# Patient Record
Sex: Female | Born: 1955 | Race: Black or African American | Hispanic: No | Smoking: Never smoker
Health system: Southern US, Community
[De-identification: ages and names within clinical notes are randomized; demographics above are authoritative.]

## PROBLEM LIST (undated history)

## (undated) DIAGNOSIS — T7840XA Allergy, unspecified, initial encounter: Secondary | ICD-10-CM

## (undated) DIAGNOSIS — E119 Type 2 diabetes mellitus without complications: Secondary | ICD-10-CM

## (undated) DIAGNOSIS — R32 Unspecified urinary incontinence: Secondary | ICD-10-CM

## (undated) DIAGNOSIS — D649 Anemia, unspecified: Secondary | ICD-10-CM

## (undated) HISTORY — DX: Anemia, unspecified: D64.9

## (undated) HISTORY — PX: ABDOMINAL HYSTERECTOMY: SHX81

## (undated) HISTORY — DX: Unspecified urinary incontinence: R32

## (undated) HISTORY — PX: KNEE SURGERY: SHX244

## (undated) HISTORY — DX: Allergy, unspecified, initial encounter: T78.40XA

## (undated) HISTORY — DX: Type 2 diabetes mellitus without complications: E11.9

## (undated) HISTORY — PX: CHOLECYSTECTOMY: SHX55

---

## 1998-03-11 ENCOUNTER — Ambulatory Visit (HOSPITAL_COMMUNITY): Admission: RE | Admit: 1998-03-11 | Discharge: 1998-03-11 | Payer: Self-pay | Admitting: Family Medicine

## 1998-08-27 ENCOUNTER — Other Ambulatory Visit: Admission: RE | Admit: 1998-08-27 | Discharge: 1998-08-27 | Payer: Self-pay | Admitting: *Deleted

## 1999-07-05 ENCOUNTER — Encounter: Admission: RE | Admit: 1999-07-05 | Discharge: 1999-07-29 | Payer: Self-pay | Admitting: *Deleted

## 1999-08-12 ENCOUNTER — Encounter: Admission: RE | Admit: 1999-08-12 | Discharge: 1999-11-10 | Payer: Self-pay | Admitting: *Deleted

## 1999-10-28 ENCOUNTER — Encounter: Admission: RE | Admit: 1999-10-28 | Discharge: 1999-10-28 | Payer: Self-pay | Admitting: *Deleted

## 1999-11-11 ENCOUNTER — Encounter: Admission: RE | Admit: 1999-11-11 | Discharge: 1999-11-11 | Payer: Self-pay | Admitting: *Deleted

## 1999-11-11 ENCOUNTER — Encounter: Payer: Self-pay | Admitting: *Deleted

## 1999-12-04 ENCOUNTER — Encounter: Payer: Self-pay | Admitting: Emergency Medicine

## 1999-12-04 ENCOUNTER — Emergency Department (HOSPITAL_COMMUNITY): Admission: EM | Admit: 1999-12-04 | Discharge: 1999-12-04 | Payer: Self-pay | Admitting: Emergency Medicine

## 1999-12-08 ENCOUNTER — Encounter: Admission: RE | Admit: 1999-12-08 | Discharge: 2000-03-07 | Payer: Self-pay | Admitting: *Deleted

## 2001-03-26 ENCOUNTER — Other Ambulatory Visit: Admission: RE | Admit: 2001-03-26 | Discharge: 2001-03-26 | Payer: Self-pay | Admitting: Obstetrics and Gynecology

## 2001-04-04 ENCOUNTER — Encounter: Payer: Self-pay | Admitting: Gastroenterology

## 2001-04-04 ENCOUNTER — Ambulatory Visit (HOSPITAL_COMMUNITY): Admission: RE | Admit: 2001-04-04 | Discharge: 2001-04-04 | Payer: Self-pay | Admitting: Gastroenterology

## 2001-04-10 ENCOUNTER — Ambulatory Visit (HOSPITAL_COMMUNITY): Admission: RE | Admit: 2001-04-10 | Discharge: 2001-04-10 | Payer: Self-pay | Admitting: Gastroenterology

## 2001-04-16 ENCOUNTER — Encounter (HOSPITAL_BASED_OUTPATIENT_CLINIC_OR_DEPARTMENT_OTHER): Payer: Self-pay | Admitting: General Surgery

## 2001-04-16 ENCOUNTER — Ambulatory Visit (HOSPITAL_COMMUNITY): Admission: RE | Admit: 2001-04-16 | Discharge: 2001-04-16 | Payer: Self-pay | Admitting: General Surgery

## 2001-04-23 ENCOUNTER — Encounter (HOSPITAL_BASED_OUTPATIENT_CLINIC_OR_DEPARTMENT_OTHER): Payer: Self-pay | Admitting: General Surgery

## 2001-04-25 ENCOUNTER — Encounter (INDEPENDENT_AMBULATORY_CARE_PROVIDER_SITE_OTHER): Payer: Self-pay | Admitting: Specialist

## 2001-04-25 ENCOUNTER — Ambulatory Visit (HOSPITAL_COMMUNITY): Admission: RE | Admit: 2001-04-25 | Discharge: 2001-04-26 | Payer: Self-pay | Admitting: General Surgery

## 2001-06-18 ENCOUNTER — Ambulatory Visit (HOSPITAL_COMMUNITY): Admission: RE | Admit: 2001-06-18 | Discharge: 2001-06-18 | Payer: Self-pay | Admitting: Family Medicine

## 2001-06-18 ENCOUNTER — Encounter: Payer: Self-pay | Admitting: Family Medicine

## 2002-02-27 ENCOUNTER — Encounter (INDEPENDENT_AMBULATORY_CARE_PROVIDER_SITE_OTHER): Payer: Self-pay | Admitting: *Deleted

## 2002-02-27 ENCOUNTER — Ambulatory Visit (HOSPITAL_COMMUNITY): Admission: RE | Admit: 2002-02-27 | Discharge: 2002-02-27 | Payer: Self-pay | Admitting: Gastroenterology

## 2002-05-19 ENCOUNTER — Emergency Department (HOSPITAL_COMMUNITY): Admission: EM | Admit: 2002-05-19 | Discharge: 2002-05-19 | Payer: Self-pay

## 2002-06-14 ENCOUNTER — Ambulatory Visit (HOSPITAL_COMMUNITY): Admission: RE | Admit: 2002-06-14 | Discharge: 2002-06-14 | Payer: Self-pay | Admitting: Anesthesiology

## 2002-06-14 ENCOUNTER — Encounter: Payer: Self-pay | Admitting: Family Medicine

## 2003-08-21 ENCOUNTER — Ambulatory Visit (HOSPITAL_COMMUNITY): Admission: RE | Admit: 2003-08-21 | Discharge: 2003-08-21 | Payer: Self-pay | Admitting: Obstetrics

## 2003-11-12 ENCOUNTER — Encounter (INDEPENDENT_AMBULATORY_CARE_PROVIDER_SITE_OTHER): Payer: Self-pay | Admitting: *Deleted

## 2003-11-12 ENCOUNTER — Inpatient Hospital Stay (HOSPITAL_COMMUNITY): Admission: RE | Admit: 2003-11-12 | Discharge: 2003-11-15 | Payer: Self-pay | Admitting: Obstetrics

## 2005-07-11 ENCOUNTER — Ambulatory Visit (HOSPITAL_COMMUNITY): Admission: RE | Admit: 2005-07-11 | Discharge: 2005-07-11 | Payer: Self-pay | Admitting: Obstetrics

## 2007-12-06 ENCOUNTER — Encounter: Admission: RE | Admit: 2007-12-06 | Discharge: 2007-12-06 | Payer: Self-pay | Admitting: Internal Medicine

## 2010-10-03 ENCOUNTER — Encounter: Payer: Self-pay | Admitting: Internal Medicine

## 2011-01-28 NOTE — Op Note (Signed)
NAME:  Anna Johns, Anna Johns                      ACCOUNT NO.:  [id number]   MEDICAL RECORD NO.:  [id number]                   PATIENT TYPE:  INP   LOCATION:  9313                                 FACILITY:  [facility]   PHYSICIAN:  Charles A. Clearance Coots, M.D.             DATE OF BIRTH:  1956-03-03   DATE OF PROCEDURE:  11/12/2003  DATE OF DISCHARGE:                                 OPERATIVE REPORT   PREOPERATIVE DIAGNOSIS:  Symptomatic uterine fibroids.   POSTOPERATIVE DIAGNOSIS:  Symptomatic uterine fibroids.   PROCEDURE:  Total abdominal hysterectomy, bilateral salpingectomy.   SURGEON:  Charles A. Clearance Coots, M.D.   ASSISTANT:  Roseanna Rainbow, M.D.   ANESTHESIA:  General.   ESTIMATED BLOOD LOSS:  300 mL.   FLUIDS REPLACED:  2200 mL.   URINE OUTPUT:  200 mL clear.   COMPLICATIONS:  None.   Foley to gravity.   FINDINGS:  A 14-week size fibroid uterus.   SPECIMENS:  Uterus with cervix, two fallopian tubes.   OPERATION:  The patient was brought to the operating room and after  satisfactory general endotracheal anesthesia, the abdomen was prepped and  draped in the usual sterile fashion and an indwelling Foley was placed.  A  Pfannenstiel skin incision was made with a scalpel.  It was deepened down to  the fascia with a scalpel.  The fascia was nicked in the midline and the  fascial incision was extended to the left and to the right with curved Mayo  scissors.  The superior and inferior fascial edges were taken off of the  rectus muscles with both blunt and sharp dissection.  The rectus muscle was  bluntly and sharply dissected in the midline.  The peritoneum was entered  digitally and was sharply extended superiorly and inferiorly, being careful  to avoid the urinary bladder inferiorly.  The uterus was then exteriorized  after the bowel was packed off.  The utero-ovarian and broad ligaments were  crossclamped on both sides with long Kelly forceps.  The round ligaments  were  identified and were transected with the Bovie and the vesicouterine  fold of peritoneum above the reflection of urinary bladder was transected  sharply with the Bovie and the urinary bladder was pushed down out of the  operative field.  The broad ligament was then indented digitally on the  right side close to the body of the uterus and the opening was created  digitally.  The utero-ovarian clamp was extended down into the opening and a  second curved parametrial clamp was placed beneath the Kelly forceps.  The  utero-ovarian and broad ligaments were then transected and suture ligated  with a transfixion suture of 0 Vicryl after a free tie was placed first of 0  Vicryl.  The same procedure was performed on the opposite side without  complications.  The uterine vessels were then skeletonized and then the  uterine vessels were crossclamped with curved parametrial  clamps.  A  straight Heaney clamp was placed above for prevention of backflow.  The  vessels were transected and suture ligated with transfixion sutures of 0  Vicryl.  The same procedure was performed on the opposite side without  complications.  The cardinal ligaments were then serially crossclamped, cut,  and suture ligated with transfixion sutures of 0 Vicryl bilaterally down to  the uterosacral ligaments.  The uterosacral ligaments were crossclamped with  curved parametrial clamps and transected and suture ligated bilaterally with  0 Vicryl transfixion sutures.  The vaginal cuff was then crossclamped with  curved parametrial clamps from each corner, which met in the center.  The  cervix and uterus were then cut away from the parametrial clamps and  submitted to pathology for evaluation.  The corners of the vaginal cuff were  suture ligated with transfixion sutures of 0 Vicryl.  The center of the  vaginal cuff was closed with figure-of-eight sutures of 0 Vicryl.  Hemostasis was excellent.  The left fallopian tube was then grasped  with the  Babcock clamp, was crossclamped at the base with Kelly forceps and was  transected and suture ligated with transfixion sutures of 0 Vicryl.  The  same procedure was performed on the opposite side without complications.  The pelvic cavity was then thoroughly irrigated with warm saline solution.  The pedicles were all examined and there was no active bleeding noted.  The  vaginal cuff was re-examined and there was no active bleeding noted.  All  packing and the self-retaining retractor which had been placed were then  removed. The abdomen was then closed as follows:  The peritoneum was grasped  with Kelly forceps and was closed with a continuous suture of 2-0 Monocryl.  The rectus muscle was approximated with interrupted sutures of 2-0 Monocryl.  The fascia was closed with a continuous suture of 0 PDS from each corner to  the center.  The subcutaneous tissue was thoroughly irrigated with warm  saline solution and all areas of subcutaneous bleeding were coagulated with  the Bovie.  The skin was then approximated with a continuous subcuticular  suture of 3-0 Monocryl.  A sterile bandage was applied to the incision  closure.  The surgical technician indicated that all needle, sponge, and  instrument counts were correct.  The patient tolerated the procedure well  and was transported to the recovery room in satisfactory condition.                                               Charles A. Clearance Coots, M.D.    CAH/MEDQ  D:  11/12/2003  T:  11/12/2003  Job:  [id number]

## 2011-01-28 NOTE — Op Note (Signed)
[place]. [facility]  Patient:    Anna Johns, Anna Johns                   MRN: [id number] Proc. Date: 04/25/01 Adm. Date:  [id number] Attending:  Sonda Primes CC:         Anselmo Rod, M.D.   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  OPERATION PERFORMED:  Laparoscopic cholecystectomy.  SURGEON:  Mardene Celeste. Lurene Shadow, M.D.  ASSISTANT:  Marnee Spring. Wiliam Ke, M.D.  ANESTHESIA:  General.  INDICATIONS FOR PROCEDURE:  The patient is a 55 year old woman with recurrent epigastric and right upper quadrant pain with radiation to the subscapular region associated with nausea and vomiting usually following meals.  She had a gallbladder ultrasound which showed no evidence of stones.  She had a hepatobiliary scan with gallbladder function study which showed essentially normal gallbladder function study.  She, however, continues to have severe epigastric and right upper quadrant pain following meals.  She is brought to the operating room now for laparoscopic cholecystectomy after the risks and benefits of surgery have been described.  DESCRIPTION OF PROCEDURE:  Following the induction of satisfactory general anesthesia, with the patient positioned supinely, the abdomen is routinely prepped and draped to be included in a sterile operative field.  Incision was approached through an infraumbilical incision that was done by removing a previous keloidal scar in the infraumbilical area where she had had previous laparoscopic surgery.  This was deepened through the skin and subcutaneous tissues down to the midline fascia.  The fascia was opened and the abdomen entered.  Hasson cannula was inserted and the peritoneum inflated to 14 mmHg pressure using CO2.  Visual exploration of the abdomen ensued.  The liver was somewhat lobulated but otherwise normal  with the duodenum and anterior gastric wall somewhat tethered to the gallbladder due to multiple scarring and evidence of inflammation to the gallbladder.  The remainder of the small and large  intestine appeared to be normal.  The adnexal structures and uterus were not seen.  Under direct vision, the epigastric and lateral ports were placed.  The gallbladder was then grasped and retracted cephalad.  Multiple adhesions to the gallbladder taken down and the dissection carried down to the ampulla of the gallbladder.  At the gallbladder ampulla there was a rather large cystic duct.  However, there was isolated and traced up to its entry into the gallbladder and down to the common duct and the cystic artery was also traced up to its entry in the gallbladder wall.  Because the patient is also allergic to intravenous dyes, we did not attempt to do a cholangiogram; however, the cystic duct was clipped proximally and opened and milked until there were no stones within the cystic duct itself. The cystic duct was then doubly clipped and transected.  The cystic artery also doubly clipped and transected.  The gallbladder dissected free from the liver bed using electrocautery and maintaining hemostasis throughout the entire dissection.  At the end of dissection, the liver bed was then thoroughly checked for hemostasis. Additional bleeding points were treated with electrocautery.  The right upper quadrant then thoroughly irrigated with saline.  Camera moved to the epigastric port and the gallbladder retrieved through the umbilical port without difficulty.  All trocars were removed under direct vision.  Sponge, instrument and sharp counts were verified.  Pneumoperitoneum deflated and the wounds closed in layers  as follows:  Umbilical wound in two layers with 0 Dexon and 4-0 Dexon.  Epigastric and lateral flank wounds closed with 4-0 Dexon sutures.  All wounds were reinforced with Steri-Strips and  sterile dressings applied.  Anesthetic reversed.  Patient removed from the operating room to the recovery room in stable condition having tolerated the procedure well. DD:  04/25/01 TD:  04/25/01 Job: [id number]

## 2011-01-28 NOTE — Procedures (Signed)
[place]. [facility]  Patient:    ALEXA, BLISH Visit Number: [id number] MRN: [id number]          Service Type: END Location: ENDO Attending Physician:  Charna Elizabeth Dictated by:   Anselmo Rod, M.D. Proc. Date: 02/27/02 Admit Date:  02/27/2002   CC:         Harlan Stains, M.D.   Procedure Report  DATE OF BIRTH:  [birth year]  PROCEDURE PERFORMED:  Esophagogastroduodenoscopy.  ENDOSCOPIST: Anselmo Rod, M.D.  INSTRUMENT USED:  Olympus video panendoscope.  INDICATION FOR PROCEDURE: The patient is a 54 year old African-American female with epigastric tenderness, rule out peptic ulcer disease, esophagitis, gastritis, etc.  PREPROCEDURE PREPARATION:  Informed consent was procured from the patient. The patient was fasted for eight hours prior to the procedure.  PREPROCEDURE PHYSICAL:  VITAL SIGNS: The patient had stable vital signs.  NECK:  Supple.  CHEST:  Clear to auscultation.  ABDOMEN:  Soft with normal bowel sounds.  DESCRIPTION OF PROCEDURE: The patient was placed in the left lateral decubitus position and sedated with 55.0 mg of Demerol and 6 mg of Versed intravenously. Once the patient was adequately sedated and maintained on low flow oxygen and continuous cardiac monitoring, the Olympus video panendoscope was advanced with a mouth piece over the tongue and into the esophagus under direct vision.  The entire esophagus appeared normal with no evidence of stricture or esophagitis.  The scope was then advanced to the stomach. There was moderate diffuse gastritis throughout the gastric mucosa.  Biopsies were done for H. pylori.  The proximal small bowel appeared normal and without lesions.  No frank ulcers, erosions, masses or polyps were seen.  IMPRESSION: Moderate diffuse gastritis, biopsies done.  RECOMMENDATION: 1. Prevacid 30 mg one p.o. q.d., #30 prescribed with three refills. 2. Await pathology results. 3. Treat  with antibiotics if Helicobacter pylori is present. 4. Avoid all nonsteroidals including aspirin. 5. Outpatient follow-up in the next two weeks. Dictated by:   Anselmo Rod, M.D. Attending Physician:  Charna Elizabeth DD:  02/27/02 TD:  02/28/02 Job: [id number]

## 2011-01-28 NOTE — Procedures (Signed)
[place]. [facility]  Patient:    Anna Johns, Anna Johns                   MRN: [id number] Proc. Date: 04/11/01 Adm. Date:  [id number] Attending:  Charna Elizabeth CC:         [name], M.D.  [name], M.D.   Procedure Report  DATE OF BIRTH:  12-Aug-1956  REFERRING PHYSICIAN:  [name], M.D.  PROCEDURE PERFORMED:  Esophagogastroduodenoscopy.  ENDOSCOPIST:  Anselmo Rod, M.D.  INSTRUMENT USED:  Olympus video panendoscope.  INDICATIONS FOR PROCEDURE:  Abdominal pain especially in the right upper quadrant epigastric area in a 55 year old African-American female with a normal abdominal ultrasound and HIDA scan, rule out peptic ulcer disease.  PREPROCEDURE PREPARATION:  Informed consent was procured from the patient. The patient was fasted for eight hours prior to the procedure.  PREPROCEDURE PHYSICAL:  The patient had stable vital signs.  Neck supple. Chest clear to auscultation.  S1, S2 regular.  Abdomen soft with epigastric and right upper quadrant tenderness on palpation.  No guarding, no rebound, no rigidity, no hepatosplenomegaly.  DESCRIPTION OF PROCEDURE:  The patient was placed in left lateral decubitus position and sedated with 40 mg of Demerol and 4 mg of Versed intravenously. Once the patient was adequately sedated and maintained on low-flow oxygen and continuous cardiac monitoring, the Olympus video panendoscope was advanced through the mouthpiece, over the tongue, into the esophagus under direct vision.  The entire esophagus appeared normal without evidence of ring, stricture, masses, lesions, esophagitis or Barretts mucosa.  The scope was then advanced to the stomach.  There was healthy-appearing gastric mucosa. There was mild duodenitis.  On examination of the duodenum bulb the small bowel distal to the bulb appeared normal.  There was no outlet obstruction. The patient tolerated the procedure well without  complications.  IMPRESSION:  Normal-appearing esophagus, stomach and proximal small bowel except for mild duodenitis in the bulb.  RECOMMENDATION: 1. Continue proton pump inhibitor for now. 2. Surgical evaluation by Dr. Lurene Shadow for possible cholecystectomy,    question biliary dyskinesia. DD:  04/11/01 TD:  04/11/01 Job: [id number]

## 2011-01-28 NOTE — Discharge Summary (Signed)
NAME:  Anna Johns, Anna Johns                      ACCOUNT NO.:  [id number]   MEDICAL RECORD NO.:  [id number]                   PATIENT TYPE:  INP   LOCATION:  9313                                 FACILITY:  [facility]   PHYSICIAN:  Roseanna Rainbow, M.D.         DATE OF BIRTH:  1955/09/27   DATE OF ADMISSION:  11/12/2003  DATE OF DISCHARGE:  11/15/2003                                 DISCHARGE SUMMARY   CHIEF COMPLAINT:  The patient is a 55 year old African American female, para  2, with symptomatic uterine fibroids who presents for a total abdominal  hysterectomy.   HISTORY OF PRESENT ILLNESS:  The patient describes heavy menses.  The  menorrhagia has been refractory to medical therapy.   ALLERGIES:  No known drug allergies.   MEDICATIONS:  Iron, ibuprofen, Nor-QD.   PAST SURGICAL HISTORY:  1. Bilateral tubal ligation.  2. Cholecystectomy.   PAST MEDICAL HISTORY:  She denies.   PHYSICAL EXAMINATION:  VITAL SIGNS:  Temperature 98.9, pulse 88, blood  pressure 115/70, weight 214 pounds, height 5 foot 7 inches.  GENERAL APPEARANCE:  Well nourished, well developed, African American female  in no apparent distress.  HEENT:  Normocephalic, atraumatic.  NECK:  Supple.  No adenopathy.  LUNGS:  Clear to auscultation bilaterally.  HEART:  Regular rate and rhythm.  BREASTS:  No masses.  No discharge.  Nontender bilaterally.  ABDOMEN:  Soft, nontender.  No organomegaly.  PELVIC:  The external female genitalia are normal-appearing.  On speculum  exam the vagina is clean.  On bimanual exam the uterus is 12-14 weeks'  aggregate size.  The adnexa are not palpable.  EXTREMITIES:  No clubbing, cyanosis or edema.  SKIN:  Without rash.   ASSESSMENT:  Symptomatic uterine fibroids.   PLAN:  Total abdominal hysterectomy.   HOSPITAL COURSE:  The patient was admitted and underwent a total abdominal  hysterectomy and bilateral salpingectomies.  Please see the dictated  operative summary as per Dr.  Coral Ceo.  Her postoperative course was  uneventful.  She was discharged to home on postoperative day #3, tolerating  a regular diet.   DISCHARGE DIAGNOSES:  Symptomatic uterine fibroids.   PROCEDURES:  1. Total abdominal hysterectomy.  2. Bilateral salpingectomies.   CONDITION:  Good.   DIET:  Regular.   ACTIVITY:  No strenuous activity, no intercourse.   MEDICATIONS:  1. Percocet 1-2 tabs every six hours as needed.  2. Ferrous sulfate 1 tab p.o. twice a day.   DISPOSITION:  The patient was to followup in the office with Dr. Clearance Coots in  two weeks.                                               Roseanna Rainbow, M.D.    Judee Clara  D:  11/15/2003  T:  11/15/2003  Job:  [id number]

## 2011-11-06 ENCOUNTER — Other Ambulatory Visit: Payer: Self-pay | Admitting: Physician Assistant

## 2011-11-19 ENCOUNTER — Telehealth: Payer: Self-pay | Admitting: Internal Medicine

## 2011-11-19 MED ORDER — OXYBUTYNIN CHLORIDE ER 5 MG PO TB24: 5.0000 mg | ORAL_TABLET | Freq: Every day | ORAL | Status: DC

## 2011-11-19 NOTE — Telephone Encounter (Signed)
Ditropan RF, pt agrees to OV before more RF's.

## 2011-12-01 ENCOUNTER — Ambulatory Visit (INDEPENDENT_AMBULATORY_CARE_PROVIDER_SITE_OTHER): Payer: BC Managed Care – PPO | Admitting: Physician Assistant

## 2011-12-01 VITALS — BP 157/82 | HR 88 | Temp 98.3°F | Resp 16 | Ht 66.5 in | Wt 225.0 lb

## 2011-12-01 DIAGNOSIS — R030 Elevated blood-pressure reading, without diagnosis of hypertension: Secondary | ICD-10-CM

## 2011-12-01 DIAGNOSIS — R7989 Other specified abnormal findings of blood chemistry: Secondary | ICD-10-CM

## 2011-12-01 DIAGNOSIS — E8881 Metabolic syndrome: Secondary | ICD-10-CM

## 2011-12-01 DIAGNOSIS — E669 Obesity, unspecified: Secondary | ICD-10-CM

## 2011-12-01 LAB — POCT CBC
Granulocyte percent: 48.7 %G (ref 37–80)
Hemoglobin: 12.3 g/dL (ref 12.2–16.2)
MCH, POC: 28.1 pg (ref 27–31.2)
MID (cbc): 0.3 (ref 0–0.9)
MPV: 8.9 fL (ref 0–99.8)
POC MID %: 6.1 %M (ref 0–12)
Platelet Count, POC: 289 10*3/uL (ref 142–424)
RBC: 4.37 M/uL (ref 4.04–5.48)
WBC: 4.5 10*3/uL — AB (ref 4.6–10.2)

## 2011-12-01 MED ORDER — OXYBUTYNIN CHLORIDE ER 5 MG PO TB24: 5.0000 mg | ORAL_TABLET | Freq: Every day | ORAL | Status: DC

## 2011-12-01 MED ORDER — MELOXICAM 15 MG PO TABS: 15.0000 mg | ORAL_TABLET | Freq: Every day | ORAL | Status: DC

## 2011-12-01 NOTE — Progress Notes (Signed)
  Subjective:    Patient ID: Anna Johns, female    DOB: 31-Mar-1956, 56 y.o.   MRN: [id number]  HPI 56 yo AA female here for refill on Ditropan XL.  She has been taking it since the fall and it is working great. Recent arthroscopic knee surgery and is healing/recovering well.  Due for MMG in the summer.  Reviewed last labs from 02/2011 and her NF glucose was elevated.  She is fasting today.  She is also interested in changing her eating and losing weight.   Review of Systems  All other systems reviewed and are negative.      Objective:   Physical Exam  Constitutional: She is oriented to person, place, and time. She appears well-developed and well-nourished.  HENT:  Head: Normocephalic and atraumatic.  Neck: Normal range of motion. Neck supple.  Cardiovascular: Normal rate, regular rhythm and normal heart sounds.   Pulmonary/Chest: Effort normal and breath sounds normal.  Neurological: She is alert and oriented to person, place, and time.  Skin: Skin is warm and dry.          Assessment & Plan:  Elevated BP w/o diagnosis of htn/obesity-likely intertwined.  Advised [employer].  Overactive bladder-Well-controlled on Ditropan. Have MMG report sent to [place].  Check fasting labs

## 2011-12-02 LAB — TSH: TSH: 1.131 u[IU]/mL (ref 0.350–4.500)

## 2011-12-02 LAB — COMPREHENSIVE METABOLIC PANEL
ALT: 14 U/L (ref 0–35)
AST: 16 U/L (ref 0–37)
Albumin: 4.6 g/dL (ref 3.5–5.2)
CO2: 26 mEq/L (ref 19–32)
Calcium: 9.4 mg/dL (ref 8.4–10.5)
Chloride: 104 mEq/L (ref 96–112)
Creat: 0.78 mg/dL (ref 0.50–1.10)
Potassium: 4 mEq/L (ref 3.5–5.3)
Sodium: 140 mEq/L (ref 135–145)
Total Protein: 7.5 g/dL (ref 6.0–8.3)

## 2011-12-02 LAB — LIPID PANEL
Cholesterol: 204 mg/dL — ABNORMAL HIGH (ref 0–200)
Total CHOL/HDL Ratio: 4.5 Ratio

## 2011-12-16 ENCOUNTER — Other Ambulatory Visit: Payer: Self-pay | Admitting: Internal Medicine

## 2011-12-19 ENCOUNTER — Telehealth (HOSPITAL_COMMUNITY): Payer: Self-pay | Admitting: *Deleted

## 2011-12-19 NOTE — ED Notes (Signed)
Monica from [employer] asked if pt. had any visits here between 05/2010 and 05/2011. I accessed chart and said no. Vassie Moselle 12/19/2011

## 2011-12-23 NOTE — Telephone Encounter (Signed)
Pt is trying to figure out if [employer] has sent over ppwork to be filled out by Dr she is stating they are trying to say that a injury is basically an existing injury and she is saying it is not, please contact her, she gave me a # to [employer] [phone]

## 2012-01-14 ENCOUNTER — Telehealth: Payer: Self-pay

## 2012-01-14 NOTE — Telephone Encounter (Signed)
Pt has a question regarding documentation in medical records regarding recommended knee surgery in June of 2012.

## 2012-01-15 NOTE — Telephone Encounter (Signed)
Patient has a question about her [employer] paperwork and what we told her at her visit. She feels [employer] is not telling her the truth.

## 2012-12-18 ENCOUNTER — Other Ambulatory Visit: Payer: Self-pay | Admitting: Physician Assistant

## 2012-12-20 ENCOUNTER — Ambulatory Visit (INDEPENDENT_AMBULATORY_CARE_PROVIDER_SITE_OTHER): Payer: BC Managed Care – PPO | Admitting: Family Medicine

## 2012-12-20 VITALS — BP 152/86 | HR 89 | Temp 98.3°F | Resp 16 | Ht 67.0 in | Wt 230.0 lb

## 2012-12-20 DIAGNOSIS — R32 Unspecified urinary incontinence: Secondary | ICD-10-CM

## 2012-12-20 DIAGNOSIS — R319 Hematuria, unspecified: Secondary | ICD-10-CM

## 2012-12-20 DIAGNOSIS — R81 Glycosuria: Secondary | ICD-10-CM

## 2012-12-20 DIAGNOSIS — D649 Anemia, unspecified: Secondary | ICD-10-CM

## 2012-12-20 LAB — POCT CBC
HCT, POC: 34.5 % — AB (ref 37.7–47.9)
Lymph, poc: 2 (ref 0.6–3.4)
MCHC: 30.4 g/dL — AB (ref 31.8–35.4)
MCV: 89.2 fL (ref 80–97)
MID (cbc): 0.5 (ref 0–0.9)
POC Granulocyte: 3 (ref 2–6.9)
POC LYMPH PERCENT: 36.5 %L (ref 10–50)
Platelet Count, POC: 265 10*3/uL (ref 142–424)
RDW, POC: 13.6 %

## 2012-12-20 LAB — POCT URINALYSIS DIPSTICK
Glucose, UA: 100
Leukocytes, UA: NEGATIVE
Nitrite, UA: POSITIVE
pH, UA: 5.5

## 2012-12-20 LAB — GLUCOSE, POCT (MANUAL RESULT ENTRY): POC Glucose: 101 mg/dl — AB (ref 70–99)

## 2012-12-20 LAB — POCT UA - MICROSCOPIC ONLY
Casts, Ur, LPF, POC: NEGATIVE
Mucus, UA: NEGATIVE

## 2012-12-20 MED ORDER — OXYBUTYNIN CHLORIDE ER 5 MG PO TB24: 5.0000 mg | ORAL_TABLET | Freq: Every day | ORAL | Status: DC

## 2012-12-20 MED ORDER — OXYBUTYNIN CHLORIDE ER 10 MG PO TB24: 10.0000 mg | ORAL_TABLET | Freq: Every day | ORAL | Status: DC

## 2012-12-20 MED ORDER — CIPROFLOXACIN HCL 500 MG PO TABS: 500.0000 mg | ORAL_TABLET | Freq: Two times a day (BID) | ORAL | Status: DC

## 2012-12-20 NOTE — Patient Instructions (Addendum)
Hematuria - Plan: POCT urinalysis dipstick, POCT UA - Microscopic Only, POCT CBC, POCT glucose (manual entry), POCT glycosylated hemoglobin (Hb A1C), Comprehensive metabolic panel, Urine culture, ciprofloxacin (CIPRO) 500 MG tablet  Glycosuria  Urinary incontinence    1.  Recommend evaluation by primary care physician for anemia. 2. If blood in urine persists despite antibiotics, will need reevaluation and referral to a urologist.

## 2012-12-20 NOTE — Progress Notes (Signed)
[address]   [place], [place]  [address]   [phone]  Subjective:    Patient ID: Anna Johns, female    DOB: 07-19-1956, 57 y.o.   MRN: [id number]  HPI This 57 y.o. female presents for evaluation of the following:  1. Hematuria: onset this morning; occurred throughout the day.  No dysuria; +frequency; +urgency; nocturia chronically.  Chronic urinary incontinence and maintained on oxybutynin; needs refill.  No history of hematuria.  No flank pain; no n/v.  Felt funny when came in; got a little dizzy.  No other sites of bleeding.  S/p hysterectomy; ovaries remaining.  No vaginal spotting.  No history of kidney stones.  No rectal bleeding. Sure that bleeding coming from urine.     Review of Systems  Constitutional: Negative for fever, chills, diaphoresis and fatigue.  Gastrointestinal: Negative for nausea, vomiting, blood in stool, anal bleeding and rectal pain.  Endocrine: Negative for polydipsia, polyphagia and polyuria.  Genitourinary: Positive for urgency, frequency and hematuria. Negative for dysuria, flank pain, decreased urine volume, vaginal bleeding, vaginal discharge, enuresis, genital sores, vaginal pain and pelvic pain.    Past Medical History  Diagnosis Date  . Urinary incontinence     Past Surgical History  Procedure Laterality Date  . Cholecystectomy    . Knee surgery    . Abdominal hysterectomy      fibroids; ovaries intact.    Prior to Admission medications   Medication Sig Start Date End Date Taking? Authorizing Provider  oxybutynin (DITROPAN-XL) 10 MG 24 hr tablet Take 1 tablet (10 mg total) by mouth daily. 12/20/12  Yes Ethelda Chick, MD  ciprofloxacin (CIPRO) 500 MG tablet Take 1 tablet (500 mg total) by mouth 2 (two) times daily. 12/20/12   Ethelda Chick, MD    No Known Allergies  History   Social History  . Marital Status: Married    Spouse Name: N/A    Number of Children: N/A  . Years of Education: N/A   Occupational History  . Not on  file.   Social History Main Topics  . Smoking status: Never Smoker   . Smokeless tobacco: Not on file  . Alcohol Use: Not on file  . Drug Use: Not on file  . Sexually Active: No   Other Topics Concern  . Not on file   Social History Narrative  . No narrative on file    Family History  Problem Relation Age of Onset  . COPD Mother   . Heart disease Mother     CHF  . Multiple myeloma Father   . Lupus Brother        Objective:   Physical Exam  Nursing note and vitals reviewed. Constitutional: She is oriented to person, place, and time. She appears well-developed and well-nourished. No distress.  Cardiovascular: Normal rate, regular rhythm and normal heart sounds.   Pulmonary/Chest: Effort normal and breath sounds normal. She has no wheezes. She has no rales.  Abdominal: Soft. Bowel sounds are normal. She exhibits no distension. There is no tenderness. There is no rebound and no guarding.  Neurological: She is alert and oriented to person, place, and time.  Skin: Skin is warm and dry. No rash noted. She is not diaphoretic.  Psychiatric: She has a normal mood and affect. Her behavior is normal. Judgment and thought content normal.          Results for orders placed in visit on 12/20/12  POCT URINALYSIS DIPSTICK  Result Value Range   Color, UA orange     Clarity, UA clear     Glucose, UA 100     Bilirubin, UA small     Ketones, UA trace     Spec Grav, UA >=1.030     Blood, UA neg     pH, UA 5.5     Protein, UA 30     Urobilinogen, UA 1.0     Nitrite, UA pos     Leukocytes, UA Negative    POCT UA - MICROSCOPIC ONLY      Result Value Range   WBC, Ur, HPF, POC 0-2     RBC, urine, microscopic 0-2     Bacteria, U Microscopic trace     Mucus, UA neg     Epithelial cells, urine per micros 1-2     Crystals, Ur, HPF, POC neg     Casts, Ur, LPF, POC neg     Yeast, UA neg      Assessment & Plan:  Hematuria - Plan: POCT urinalysis dipstick, POCT UA - Microscopic  Only, POCT CBC, POCT glucose (manual entry), POCT glycosylated hemoglobin (Hb A1C), Comprehensive metabolic panel, Urine culture, ciprofloxacin (CIPRO) 500 MG tablet, Ambulatory referral to Urology  Glycosuria  Urinary incontinence - Plan: Ambulatory referral to Urology  Anemia, unspecified   1. Hematuria Gross:  New.  Pt reports gross hematuria before visit.  Send urine culture; treat empirically for UTI while awaiting urine culture. If urine culture negative, will warrant urological evaluation.  Obtain BUN/Cr.  No associated flank pain, nausea, or vomiting to suggest acute nephrolithiasis. 2.  Glycosuria: New.  HgbA1c in prediabetic range and pt aware with previous diagnosis. 3. Urinary Incontinence:  Uncontrolled; agreeable to increased dose of Oxybutynin. If no improvement with increased dose, recommend urology consultation. 4. Anemia:  Chronic issue per patient; recommend follow-up with PCP to discuss and review. 5. Glucose Intolerance: New to this provider. Recommend weight loss, exercise, low sugar food intake.  Meds ordered this encounter  Medications  . ciprofloxacin (CIPRO) 500 MG tablet    Sig: Take 1 tablet (500 mg total) by mouth 2 (two) times daily.    Dispense:  20 tablet    Refill:  0  . DISCONTD: oxybutynin (DITROPAN-XL) 5 MG 24 hr tablet    Sig: Take 1 tablet (5 mg total) by mouth daily.    Dispense:  30 tablet    Refill:  11    PATIENT NEEDS OFFICE VISIT FOR ADDITIONAL REFILLS  . oxybutynin (DITROPAN-XL) 10 MG 24 hr tablet    Sig: Take 1 tablet (10 mg total) by mouth daily.    Dispense:  30 tablet    Refill:  11

## 2012-12-21 LAB — COMPREHENSIVE METABOLIC PANEL
BUN: 18 mg/dL (ref 6–23)
CO2: 27 mEq/L (ref 19–32)
Calcium: 9.7 mg/dL (ref 8.4–10.5)
Chloride: 105 mEq/L (ref 96–112)
Creat: 0.8 mg/dL (ref 0.50–1.10)
Total Bilirubin: 0.9 mg/dL (ref 0.3–1.2)

## 2012-12-22 LAB — URINE CULTURE: Colony Count: NO GROWTH

## 2013-01-04 ENCOUNTER — Encounter: Payer: Self-pay | Admitting: *Deleted

## 2013-01-13 ENCOUNTER — Encounter: Payer: Self-pay | Admitting: Family Medicine

## 2013-03-20 ENCOUNTER — Telehealth: Payer: Self-pay

## 2013-03-20 NOTE — Telephone Encounter (Signed)
Urine culture negative, referred to Urology. Left message for her to call me back.

## 2013-03-20 NOTE — Telephone Encounter (Signed)
Pt is calling because she states that she hasn't heard anything about her lab results from her visit and she also states that her symptoms are getting worse and she said that she does have an apt with the dr we referred her to coming up Call back number is [phone]

## 2013-03-22 NOTE — Telephone Encounter (Signed)
LMOM to CB. 

## 2013-03-26 NOTE — Telephone Encounter (Signed)
Patient not returning calls. Hopefully she has seen urology.

## 2013-05-20 ENCOUNTER — Encounter: Payer: Self-pay | Admitting: Obstetrics

## 2013-07-20 ENCOUNTER — Ambulatory Visit (INDEPENDENT_AMBULATORY_CARE_PROVIDER_SITE_OTHER): Payer: BC Managed Care – PPO | Admitting: Family Medicine

## 2013-07-20 VITALS — BP 130/80 | HR 109 | Temp 98.2°F | Resp 16 | Ht 67.0 in | Wt 223.0 lb

## 2013-07-20 DIAGNOSIS — K5289 Other specified noninfective gastroenteritis and colitis: Secondary | ICD-10-CM

## 2013-07-20 DIAGNOSIS — K529 Noninfective gastroenteritis and colitis, unspecified: Secondary | ICD-10-CM

## 2013-07-20 DIAGNOSIS — R197 Diarrhea, unspecified: Secondary | ICD-10-CM

## 2013-07-20 DIAGNOSIS — R112 Nausea with vomiting, unspecified: Secondary | ICD-10-CM

## 2013-07-20 MED ORDER — ONDANSETRON 4 MG PO TBDP: 4.0000 mg | ORAL_TABLET | Freq: Three times a day (TID) | ORAL | Status: DC | PRN

## 2013-07-20 NOTE — Progress Notes (Signed)
Subjective:  This chart was scribed for Anna Staggers, MD by Quintella Reichert, ED scribe.  This patient was seen in room [facility] Room 4 and the patient's care was started at 3:05 PM.   Patient ID: Anna Johns, female    DOB: Nov 26, 1955, 57 y.o.   MRN: [id number]  Chief Complaint  Patient presents with   Diarrhea    x 1 day   Nausea    HPI  Anna STANGELO is a 57 y.o. female PCP: Billee Cashing, MD  Pt presents with nausea, vomiting, diarrhea and fatigue that began yesterday morning.  She states that she.had cold symptoms last week and was taking Nyquil.  She otherwise felt well until yesterday morning at 5 AM at which time she developed nausea, vomiting and diarrhea.  She vomited 3-4 times and had over 10 episodes of diarrhea yesterday.  Diarrhea is described as "watery."  She denies blood in stool or vomitus.  Diarrhea continued all throughout the night and today.  She last vomited last night and states she is no longer nauseated but her stomach "feels irritable."  She has attempted to treat symptoms with Pepto Bismol, without relief.  She has not attempted any other treatments pta.  She has been drinking water and lemonade.  She has only been able to eat saltine crackers.  She states that she urinated 2-3 times today and her urine was "extremely light."  She denies fevers.   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  She denies recent antibiotic usage, travel, or suspect food intake.  Surgical history includes cholecystectomy, tubal ligation, and partial hysterectomy.   There are no active problems to display for this patient.   Past Medical History  Diagnosis Date   Urinary incontinence    Anemia    Glucose intolerance (impaired glucose tolerance) 12/25/2012    HgbA1c of 6.1.    Past Surgical History  Procedure Laterality Date   Cholecystectomy     Knee  surgery     Abdominal hysterectomy      fibroids; ovaries intact.    Allergies  Allergen Reactions   Fish Allergy     Muscles and Bluefish    Prior to Admission medications   Medication Sig Start Date End Date Taking? Authorizing Provider  oxybutynin (DITROPAN-XL) 10 MG 24 hr tablet Take 1 tablet (10 mg total) by mouth daily. 12/20/12  Yes Ethelda Chick, MD  ;   Review of Systems  Constitutional: Positive for fatigue. Negative for fever.  Gastrointestinal: Positive for nausea, vomiting, abdominal pain (stomach "feels irritable") and diarrhea. Negative for blood in stool.  Genitourinary: Negative for decreased urine volume.       Objective:   Physical Exam  Nursing note and vitals reviewed. Constitutional: She is oriented to person, place, and time. She appears well-developed and well-nourished. No distress.  HENT:  Head: Normocephalic and atraumatic.  Right Ear: Hearing, tympanic membrane, external ear and ear canal normal.  Left Ear: Hearing, tympanic membrane, external ear and ear canal normal.  Nose: Nose normal.  Mouth/Throat: Oropharynx is clear and moist and mucous membranes are normal. No oropharyngeal exudate.  Eyes: Conjunctivae and EOM are normal. Pupils are equal, round, and reactive to light.  Neck: Neck supple. No tracheal deviation present.  Cardiovascular: Regular rhythm, normal heart sounds and intact distal pulses.  Tachycardia present.   No murmur heard. Slightly tachycardic  at a rate of 100-110  Pulmonary/Chest: Effort normal and breath sounds normal. No respiratory distress. She has no wheezes. She has no rhonchi.  Abdominal: Soft. There is tenderness (diffuse, mild tenderness to palpation.  no focal tenderness.). There is no rebound and no guarding.  Musculoskeletal: Normal range of motion.  Neurological: She is alert and oriented to person, place, and time.  Skin: Skin is warm and dry. No rash noted.  Psychiatric: She has a normal mood and affect.  Her behavior is normal.     Filed Vitals:   07/20/13 1435  BP: 130/80  Pulse: 109  Temp: 98.2 F (36.8 C)  TempSrc: Oral  Resp: 16  Height: 5\' 7"  (1.702 m)  Weight: 223 lb (101.152 kg)  SpO2: 98%        Assessment & Plan:   Anna Johns is a 57 y.o. female N&V (nausea and vomiting) - Plan: ondansetron (ZOFRAN ODT) 4 MG disintegrating tablet  Diarrhea  Noninfectious gastroenteritis and colitis  Suspected viral GE with other sick contacts, improving vomiting.  Discussed IVF, but would like to try ORT tonight - sx care discussed and RTC precautions given. Out of work Monday if needed.   Meds ordered this encounter  Medications   ondansetron (ZOFRAN ODT) 4 MG disintegrating tablet    Sig: Take 1 tablet (4 mg total) by mouth every 8 (eight) hours as needed for nausea or vomiting.    Dispense:  10 tablet    Refill:  0   Patient Instructions  Your symptoms appear to be due to a virus, and should improve in the next 24-48 hours. Frequent sips of fluids, and if needed - return here or the emergency room for IV fluids if unable to keep sufficient fluids down. Zofran if needed for nausea, and other instructions as below.    Gastroenteritis:  Diarrhea Infections caused by germs (bacterial) or a virus commonly cause diarrhea. Your caregiver has determined that with time, rest and fluids, the diarrhea should improve. In general, eat normally while drinking more water than usual. Although water may prevent dehydration, it does not contain salt and minerals (electrolytes). Broths, weak tea without caffeine and oral rehydration solutions (ORS) replace fluids and electrolytes. Small amounts of fluids should be taken frequently. Large amounts at one time may not be tolerated. Plain water may be harmful in infants and the elderly. Oral rehydrating solutions (ORS) are available at pharmacies and grocery stores. ORS replace water and important electrolytes in proper proportions. Sports  drinks are not as effective as ORS and may be harmful due to sugars worsening diarrhea.  ORS is especially recommended for use in children with diarrhea. As a general guideline for children, replace any new fluid losses from diarrhea and/or vomiting with ORS as follows:   If your child weighs 22 pounds or under (10 kg or less), give 60-120 mL ( -  cup or 2 - 4 ounces) of ORS for each episode of diarrheal stool or vomiting episode.   If your child weighs more than 22 pounds (more than 10 kgs), give 120-240 mL ( - 1 cup or 4 - 8 ounces) of ORS for each diarrheal stool or episode of vomiting.   While correcting for dehydration, children should eat normally. However, foods high in sugar should be avoided because this may worsen diarrhea. Large amounts of carbonated soft drinks, juice, gelatin desserts and other highly sugared drinks should be avoided.   After correction of dehydration, other liquids that are appealing  to the child may be added. Children should drink small amounts of fluids frequently and fluids should be increased as tolerated. Children should drink enough fluids to keep urine clear or pale yellow.   Adults should eat normally while drinking more fluids than usual. Drink small amounts of fluids frequently and increase as tolerated. Drink enough fluids to keep urine clear or pale yellow. Broths, weak decaffeinated tea, lemon lime soft drinks (allowed to go flat) and ORS replace fluids and electrolytes.   Avoid:   Carbonated drinks.   Juice.   Extremely hot or cold fluids.   Caffeine drinks.   Fatty, greasy foods.   Alcohol.   Tobacco.   Too much intake of anything at one time.   Gelatin desserts.   Probiotics are active cultures of beneficial bacteria. They may lessen the amount and number of diarrheal stools in adults. Probiotics can be found in yogurt with active cultures and in supplements.   Wash hands well to avoid spreading bacteria and virus.    Anti-diarrheal medications are not recommended for infants and children.   Only take over-the-counter or prescription medicines for pain, discomfort or fever as directed by your caregiver. Do not give aspirin to children because it may cause Reye's Syndrome.   For adults, ask your caregiver if you should continue all prescribed and over-the-counter medicines.   If your caregiver has given you a follow-up appointment, it is very important to keep that appointment. Not keeping the appointment could result in a chronic or permanent injury, and disability. If there is any problem keeping the appointment, you must call back to this facility for assistance.  SEEK IMMEDIATE MEDICAL CARE IF:   You or your child is unable to keep fluids down or other symptoms or problems become worse in spite of treatment.   Vomiting or diarrhea develops and becomes persistent.   There is vomiting of blood or bile (green material).   There is blood in the stool or the stools are black and tarry.   There is no urine output in 6-8 hours or there is only a small amount of very dark urine.   Abdominal pain develops, increases or localizes.   You have a fever.   Your baby is older than 3 months with a rectal temperature of 102 F (38.9 C) or higher.   Your baby is 57 months old or younger with a rectal temperature of 100.4 F (38 C) or higher.   You or your child develops excessive weakness, dizziness, fainting or extreme thirst.   You or your child develops a rash, stiff neck, severe headache or become irritable or sleepy and difficult to awaken.  MAKE SURE YOU:   Understand these instructions.   Will watch your condition.   Will get help right away if you are not doing well or get worse.  Document Released: 08/19/2002 Document Revised: 08/18/2011 Document Reviewed: 07/06/2009 [facility] Patient Information 2012 [place], [place].  Nausea and Vomiting Nausea is a sick feeling that often comes before  throwing up (vomiting). Vomiting is a reflex where stomach contents come out of your mouth. Vomiting can cause severe loss of body fluids (dehydration). Children and elderly adults can become dehydrated quickly, especially if they also have diarrhea. Nausea and vomiting are symptoms of a condition or disease. It is important to find the cause of your symptoms. CAUSES   Direct irritation of the stomach lining. This irritation can result from increased acid production (gastroesophageal reflux disease), infection, food poisoning, taking  certain medicines (such as nonsteroidal anti-inflammatory drugs), alcohol use, or tobacco use.   Signals from the brain.These signals could be caused by a headache, heat exposure, an inner ear disturbance, increased pressure in the brain from injury, infection, a tumor, or a concussion, pain, emotional stimulus, or metabolic problems.   An obstruction in the gastrointestinal tract (bowel obstruction).   Illnesses such as diabetes, hepatitis, gallbladder problems, appendicitis, kidney problems, cancer, sepsis, atypical symptoms of a heart attack, or eating disorders.   Medical treatments such as chemotherapy and radiation.   Receiving medicine that makes you sleep (general anesthetic) during surgery.  DIAGNOSIS Your caregiver may ask for tests to be done if the problems do not improve after a few days. Tests may also be done if symptoms are severe or if the reason for the nausea and vomiting is not clear. Tests may include:  Urine tests.   Blood tests.   Stool tests.   Cultures (to look for evidence of infection).   X-rays or other imaging studies.  Test results can help your caregiver make decisions about treatment or the need for additional tests. TREATMENT You need to stay well hydrated. Drink frequently but in small amounts.You may wish to drink water, sports drinks, clear broth, or eat frozen ice pops or gelatin dessert to help stay hydrated.When you  eat, eating slowly may help prevent nausea.There are also some antinausea medicines that may help prevent nausea. HOME CARE INSTRUCTIONS   Take all medicine as directed by your caregiver.   If you do not have an appetite, do not force yourself to eat. However, you must continue to drink fluids.   If you have an appetite, eat a normal diet unless your caregiver tells you differently.   Eat a variety of complex carbohydrates (rice, wheat, potatoes, bread), lean meats, yogurt, fruits, and vegetables.   Avoid high-fat foods because they are more difficult to digest.   Drink enough water and fluids to keep your urine clear or pale yellow.   If you are dehydrated, ask your caregiver for specific rehydration instructions. Signs of dehydration may include:   Severe thirst.   Dry lips and mouth.   Dizziness.   Dark urine.   Decreasing urine frequency and amount.   Confusion.   Rapid breathing or pulse.  SEEK IMMEDIATE MEDICAL CARE IF:   You have blood or brown flecks (like coffee grounds) in your vomit.   You have black or bloody stools.   You have a severe headache or stiff neck.   You are confused.   You have severe abdominal pain.   You have chest pain or trouble breathing.   You do not urinate at least once every 8 hours.   You develop cold or clammy skin.   You continue to vomit for longer than 24 to 48 hours.   You have a fever.  MAKE SURE YOU:   Understand these instructions.   Will watch your condition.   Will get help right away if you are not doing well or get worse.  Document Released: 08/29/2005 Document Revised: 08/18/2011 Document Reviewed: 01/26/2011 [facility] Patient Information 2012 [place], [place].  Return to the clinic or go to the nearest emergency room if any of your symptoms worsen or new symptoms occur.    I personally performed the services described in this documentation, which was scribed in my presence. The recorded information has  been reviewed and considered, and addended by me as needed.

## 2013-07-20 NOTE — Patient Instructions (Signed)
Your symptoms appear to be due to a virus, and should improve in the next 24-48 hours. Frequent sips of fluids, and if needed - return here or the emergency room for IV fluids if unable to keep sufficient fluids down. Zofran if needed for nausea, and other instructions as below.    Gastroenteritis:  Diarrhea Infections caused by germs (bacterial) or a virus commonly cause diarrhea. Your caregiver has determined that with time, rest and fluids, the diarrhea should improve. In general, eat normally while drinking more water than usual. Although water may prevent dehydration, it does not contain salt and minerals (electrolytes). Broths, weak tea without caffeine and oral rehydration solutions (ORS) replace fluids and electrolytes. Small amounts of fluids should be taken frequently. Large amounts at one time may not be tolerated. Plain water may be harmful in infants and the elderly. Oral rehydrating solutions (ORS) are available at pharmacies and grocery stores. ORS replace water and important electrolytes in proper proportions. Sports drinks are not as effective as ORS and may be harmful due to sugars worsening diarrhea.  ORS is especially recommended for use in children with diarrhea. As a general guideline for children, replace any new fluid losses from diarrhea and/or vomiting with ORS as follows:   If your child weighs 22 pounds or under (10 kg or less), give 60-120 mL ( -  cup or 2 - 4 ounces) of ORS for each episode of diarrheal stool or vomiting episode.   If your child weighs more than 22 pounds (more than 10 kgs), give 120-240 mL ( - 1 cup or 4 - 8 ounces) of ORS for each diarrheal stool or episode of vomiting.   While correcting for dehydration, children should eat normally. However, foods high in sugar should be avoided because this may worsen diarrhea. Large amounts of carbonated soft drinks, juice, gelatin desserts and other highly sugared drinks should be avoided.   After correction  of dehydration, other liquids that are appealing to the child may be added. Children should drink small amounts of fluids frequently and fluids should be increased as tolerated. Children should drink enough fluids to keep urine clear or pale yellow.   Adults should eat normally while drinking more fluids than usual. Drink small amounts of fluids frequently and increase as tolerated. Drink enough fluids to keep urine clear or pale yellow. Broths, weak decaffeinated tea, lemon lime soft drinks (allowed to go flat) and ORS replace fluids and electrolytes.   Avoid:   Carbonated drinks.   Juice.   Extremely hot or cold fluids.   Caffeine drinks.   Fatty, greasy foods.   Alcohol.   Tobacco.   Too much intake of anything at one time.   Gelatin desserts.   Probiotics are active cultures of beneficial bacteria. They may lessen the amount and number of diarrheal stools in adults. Probiotics can be found in yogurt with active cultures and in supplements.   Wash hands well to avoid spreading bacteria and virus.   Anti-diarrheal medications are not recommended for infants and children.   Only take over-the-counter or prescription medicines for pain, discomfort or fever as directed by your caregiver. Do not give aspirin to children because it may cause Reye's Syndrome.   For adults, ask your caregiver if you should continue all prescribed and over-the-counter medicines.   If your caregiver has given you a follow-up appointment, it is very important to keep that appointment. Not keeping the appointment could result in a chronic or permanent injury, and  disability. If there is any problem keeping the appointment, you must call back to this facility for assistance.  SEEK IMMEDIATE MEDICAL CARE IF:   You or your child is unable to keep fluids down or other symptoms or problems become worse in spite of treatment.   Vomiting or diarrhea develops and becomes persistent.   There is vomiting of  blood or bile (green material).   There is blood in the stool or the stools are black and tarry.   There is no urine output in 6-8 hours or there is only a small amount of very dark urine.   Abdominal pain develops, increases or localizes.   You have a fever.   Your baby is older than 3 months with a rectal temperature of 102 F (38.9 C) or higher.   Your baby is 93 months old or younger with a rectal temperature of 100.4 F (38 C) or higher.   You or your child develops excessive weakness, dizziness, fainting or extreme thirst.   You or your child develops a rash, stiff neck, severe headache or become irritable or sleepy and difficult to awaken.  MAKE SURE YOU:   Understand these instructions.   Will watch your condition.   Will get help right away if you are not doing well or get worse.  Document Released: 08/19/2002 Document Revised: 08/18/2011 Document Reviewed: 07/06/2009 [facility] Patient Information 2012 [place], [place].  Nausea and Vomiting Nausea is a sick feeling that often comes before throwing up (vomiting). Vomiting is a reflex where stomach contents come out of your mouth. Vomiting can cause severe loss of body fluids (dehydration). Children and elderly adults can become dehydrated quickly, especially if they also have diarrhea. Nausea and vomiting are symptoms of a condition or disease. It is important to find the cause of your symptoms. CAUSES   Direct irritation of the stomach lining. This irritation can result from increased acid production (gastroesophageal reflux disease), infection, food poisoning, taking certain medicines (such as nonsteroidal anti-inflammatory drugs), alcohol use, or tobacco use.   Signals from the brain.These signals could be caused by a headache, heat exposure, an inner ear disturbance, increased pressure in the brain from injury, infection, a tumor, or a concussion, pain, emotional stimulus, or metabolic problems.   An obstruction in the  gastrointestinal tract (bowel obstruction).   Illnesses such as diabetes, hepatitis, gallbladder problems, appendicitis, kidney problems, cancer, sepsis, atypical symptoms of a heart attack, or eating disorders.   Medical treatments such as chemotherapy and radiation.   Receiving medicine that makes you sleep (general anesthetic) during surgery.  DIAGNOSIS Your caregiver may ask for tests to be done if the problems do not improve after a few days. Tests may also be done if symptoms are severe or if the reason for the nausea and vomiting is not clear. Tests may include:  Urine tests.   Blood tests.   Stool tests.   Cultures (to look for evidence of infection).   X-rays or other imaging studies.  Test results can help your caregiver make decisions about treatment or the need for additional tests. TREATMENT You need to stay well hydrated. Drink frequently but in small amounts.You may wish to drink water, sports drinks, clear broth, or eat frozen ice pops or gelatin dessert to help stay hydrated.When you eat, eating slowly may help prevent nausea.There are also some antinausea medicines that may help prevent nausea. HOME CARE INSTRUCTIONS   Take all medicine as directed by your caregiver.   If you  do not have an appetite, do not force yourself to eat. However, you must continue to drink fluids.   If you have an appetite, eat a normal diet unless your caregiver tells you differently.   Eat a variety of complex carbohydrates (rice, wheat, potatoes, bread), lean meats, yogurt, fruits, and vegetables.   Avoid high-fat foods because they are more difficult to digest.   Drink enough water and fluids to keep your urine clear or pale yellow.   If you are dehydrated, ask your caregiver for specific rehydration instructions. Signs of dehydration may include:   Severe thirst.   Dry lips and mouth.   Dizziness.   Dark urine.   Decreasing urine frequency and amount.   Confusion.    Rapid breathing or pulse.  SEEK IMMEDIATE MEDICAL CARE IF:   You have blood or brown flecks (like coffee grounds) in your vomit.   You have black or bloody stools.   You have a severe headache or stiff neck.   You are confused.   You have severe abdominal pain.   You have chest pain or trouble breathing.   You do not urinate at least once every 8 hours.   You develop cold or clammy skin.   You continue to vomit for longer than 24 to 48 hours.   You have a fever.  MAKE SURE YOU:   Understand these instructions.   Will watch your condition.   Will get help right away if you are not doing well or get worse.  Document Released: 08/29/2005 Document Revised: 08/18/2011 Document Reviewed: 01/26/2011 [facility] Patient Information 2012 [place].  Return to the clinic or go to the nearest emergency room if any of your symptoms worsen or new symptoms occur.

## 2013-12-14 ENCOUNTER — Other Ambulatory Visit: Payer: Self-pay | Admitting: Family Medicine

## 2014-01-21 ENCOUNTER — Other Ambulatory Visit: Payer: Self-pay | Admitting: Physician Assistant

## 2014-01-26 ENCOUNTER — Other Ambulatory Visit: Payer: Self-pay | Admitting: Physician Assistant

## 2014-02-22 ENCOUNTER — Other Ambulatory Visit: Payer: Self-pay | Admitting: Physician Assistant

## 2014-03-16 ENCOUNTER — Other Ambulatory Visit: Payer: Self-pay | Admitting: Physician Assistant

## 2014-03-17 ENCOUNTER — Ambulatory Visit (INDEPENDENT_AMBULATORY_CARE_PROVIDER_SITE_OTHER): Payer: BC Managed Care – PPO | Admitting: Family Medicine

## 2014-03-17 VITALS — BP 152/90 | HR 100 | Temp 98.1°F | Resp 18 | Ht 66.5 in | Wt 225.0 lb

## 2014-03-17 DIAGNOSIS — E669 Obesity, unspecified: Secondary | ICD-10-CM

## 2014-03-17 DIAGNOSIS — I10 Essential (primary) hypertension: Secondary | ICD-10-CM

## 2014-03-17 DIAGNOSIS — R609 Edema, unspecified: Secondary | ICD-10-CM

## 2014-03-17 DIAGNOSIS — R32 Unspecified urinary incontinence: Secondary | ICD-10-CM

## 2014-03-17 MED ORDER — OXYBUTYNIN CHLORIDE ER 10 MG PO TB24: 10.0000 mg | ORAL_TABLET | Freq: Every day | ORAL | Status: DC

## 2014-03-17 NOTE — Progress Notes (Deleted)
Subjective:  This chart was scribed for Norberto SorensonEva Shaw, MD by Evon Slackerrance Branch, ED Scribe. This Patient was seen in room 09 and the patients care was started at 7:18 PM   Patient ID: Anna Johns, female    DOB: 01-Jan-1956, 58 y.o.   MRN: [id number]  HPI Chief Complaint  Patient presents with   Medication Refill    Oxybutynin   Chronic urinary incontinence maintained on ditropan Seen 1 year prior for gross hematuria and referred to urology History of pre diabetes, no history of high blood pressure. Previous pressure here is 150/80.    HPI Comments: Anna Johns is a 58 y.o. female who presents to the [facility] complaining of medication refill of her ditropan. She states this medication is great. She denies having difficulty urinating. She states that most night she can sleep through the night without getting up to urinate.   She states that her legs and ankles are swollen today also onset 2 months prior. She states that she feels her ankles are more swollen in the morning. She denies ankle pain, or SOB.   Her blood pressure is elevated today. She states that this is due to new stressors on her life. She states that she is going to live a healthier lifestyle to see if that helps maintain her blood pressure.   Past Medical History  Diagnosis Date   Urinary incontinence    Anemia    Glucose intolerance (impaired glucose tolerance) 12/25/2012    HgbA1c of 6.1.   Allergy    No current outpatient prescriptions on file prior to visit.   No current facility-administered medications on file prior to visit.   Allergies  Allergen Reactions   Fish Allergy     Muscles and Bluefish     Review of Systems  Respiratory: Negative for shortness of breath.   Genitourinary: Negative for difficulty urinating.  Musculoskeletal: Negative for arthralgias.   Objective:  BP 152/90   Pulse 100   Temp(Src) 98.1 F (36.7 C) (Oral)   Resp 18   Ht 5' 6.5" (1.689 m)    Wt 225 lb (102.059 kg)   BMI 35.78 kg/m2   SpO2 98%  Physical Exam  Nursing note and vitals reviewed. Constitutional: She is oriented to person, place, and time. She appears well-developed and well-nourished. No distress.  HENT:  Head: Normocephalic and atraumatic.  Eyes: Conjunctivae and EOM are normal.  Neck: Neck supple. No tracheal deviation present. No thyromegaly present.  Cardiovascular: Normal rate, regular rhythm, S1 normal, S2 normal and normal heart sounds.   No murmur heard. Pulses:      Dorsalis pedis pulses are 2+ on the right side, and 2+ on the left side.       Posterior tibial pulses are 2+ on the right side, and 2+ on the left side.  Pulmonary/Chest: Effort normal and breath sounds normal. No respiratory distress.  Musculoskeletal: Normal range of motion. She exhibits edema.  2+ non pitting edema on right 1+ non pitting edema on left  Neurological: She is alert and oriented to person, place, and time.  Skin: Skin is warm and dry.  Psychiatric: She has a normal mood and affect. Her behavior is normal.    Assessment & Plan:   Essential hypertension, benign  Urinary incontinence, unspecified incontinence type  Edema  Obesity (BMI 30-39.9)  Meds ordered this encounter  Medications   oxybutynin (DITROPAN-XL) 10 MG 24 hr tablet    Sig: Take 1  tablet (10 mg total) by mouth at bedtime.    Dispense:  90 tablet    Refill:  3    I personally performed the services described in this documentation, which was scribed in my presence. The recorded information has been reviewed and considered, and addended by me as needed.  Norberto SorensonEva Shaw, MD MPH

## 2014-03-17 NOTE — Patient Instructions (Addendum)
Peripheral Edema You have swelling in your legs (peripheral edema). This swelling is due to excess accumulation of salt and water in your body. Edema may be a sign of heart, kidney or liver disease, or a side effect of a medication. It may also be due to problems in the leg veins. Elevating your legs and using special support stockings may be very helpful, if the cause of the swelling is due to poor venous circulation. Avoid long periods of standing, whatever the cause. Treatment of edema depends on identifying the cause. Chips, pretzels, pickles and other salty foods should be avoided. Restricting salt in your diet is almost always needed. Water pills (diuretics) are often used to remove the excess salt and water from your body via urine. These medicines prevent the kidney from reabsorbing sodium. This increases urine flow. Diuretic treatment may also result in lowering of potassium levels in your body. Potassium supplements may be needed if you have to use diuretics daily. Daily weights can help you keep track of your progress in clearing your edema. You should call your caregiver for follow up care as recommended. SEEK IMMEDIATE MEDICAL CARE IF:   You have increased swelling, pain, redness, or heat in your legs.  You develop shortness of breath, especially when lying down.  You develop chest or abdominal pain, weakness, or fainting.  You have a fever. Document Released: 10/06/2004 Document Revised: 11/21/2011 Document Reviewed: 09/16/2009 [facility] Patient Information 2015 [place], [place]. This information is not intended to replace advice given to you by your health care provider. Make sure you discuss any questions you have with your health care provider.   Edema Edema is an abnormal buildup of fluids in your bodytissues. Edema is somewhatdependent on gravity to pull the fluid to the lowest place in your body. That makes the condition more common in the legs and thighs (lower extremities).  Painless swelling of the feet and ankles is common and becomes more likely as you get older. It is also common in looser tissues, like around your eyes.  When the affected area is squeezed, the fluid may move out of that spot and leave a dent for a few moments. This dent is called pitting.  CAUSES  There are many possible causes of edema. Eating too much salt and being on your feet or sitting for a long time can cause edema in your legs and ankles. Hot weather may make edema worse. Common medical causes of edema include:  Heart failure.  Liver disease.  Kidney disease.  Weak blood vessels in your legs.  Cancer.  An injury.  Pregnancy.  Some medications.  Obesity. SYMPTOMS  Edema is usually painless.Your skin may look swollen or shiny.  DIAGNOSIS  Your health care provider may be able to diagnose edema by asking about your medical history and doing a physical exam. You may need to have tests such as X-rays, an electrocardiogram, or blood tests to check for medical conditions that may cause edema.  TREATMENT  Edema treatment depends on the cause. If you have heart, liver, or kidney disease, you need the treatment appropriate for these conditions. General treatment may include:  Elevation of the affected body part above the level of your heart.  Compression of the affected body part. Pressure from elastic bandages or support stockings squeezes the tissues and forces fluid back into the blood vessels. This keeps fluid from entering the tissues.  Restriction of fluid and salt intake.  Use of a water pill (diuretic). These medications  are appropriate only for some types of edema. They pull fluid out of your body and make you urinate more often. This gets rid of fluid and reduces swelling, but diuretics can have side effects. Only use diuretics as directed by your health care provider. HOME CARE INSTRUCTIONS   Keep the affected body part above the level of your heart when you are  lying down.   Do not sit still or stand for prolonged periods.   Do not put anything directly under your knees when lying down.  Do not wear constricting clothing or garters on your upper legs.   Exercise your legs to work the fluid back into your blood vessels. This may help the swelling go down.   Wear elastic bandages or support stockings to reduce ankle swelling as directed by your health care provider.   Eat a low-salt diet to reduce fluid if your health care provider recommends it.   Only take medicines as directed by your health care provider. SEEK MEDICAL CARE IF:   Your edema is not responding to treatment.  You have heart, liver, or kidney disease and notice symptoms of edema.  You have edema in your legs that does not improve after elevating them.   You have sudden and unexplained weight gain. SEEK IMMEDIATE MEDICAL CARE IF:   You develop shortness of breath or chest pain.   You cannot breathe when you lie down.  You develop pain, redness, or warmth in the swollen areas.   You have heart, liver, or kidney disease and suddenly get edema.  You have a fever and your symptoms suddenly get worse. MAKE SURE YOU:   Understand these instructions.  Will watch your condition.  Will get help right away if you are not doing well or get worse. Document Released: 08/29/2005 Document Revised: 09/03/2013 Document Reviewed: 06/21/2013 [facility] Patient Information 2015 [place]. This information is not intended to replace advice given to you by your health care provider. Make sure you discuss any questions you have with your health care provider. Hypertension Hypertension, commonly called high blood pressure, is when the force of blood pumping through your arteries is too strong. Your arteries are the blood vessels that carry blood from your heart throughout your body. A blood pressure reading consists of a higher number over a lower number, such as 110/72. The  higher number (systolic) is the pressure inside your arteries when your heart pumps. The lower number (diastolic) is the pressure inside your arteries when your heart relaxes. Ideally you want your blood pressure below 120/80. Hypertension forces your heart to work harder to pump blood. Your arteries may become narrow or stiff. Having hypertension puts you at risk for heart disease, stroke, and other problems.  RISK FACTORS Some risk factors for high blood pressure are controllable. Others are not.  Risk factors you cannot control include:   Race. You may be at higher risk if you are African American.  Age. Risk increases with age.  Gender. Men are at higher risk than women before age 57 years. After age 57, women are at higher risk than men. Risk factors you can control include:  Not getting enough exercise or physical activity.  Being overweight.  Getting too much fat, sugar, calories, or salt in your diet.  Drinking too much alcohol. SIGNS AND SYMPTOMS Hypertension does not usually cause signs or symptoms. Extremely high blood pressure (hypertensive crisis) may cause headache, anxiety, shortness of breath, and nosebleed. DIAGNOSIS  To check if you  have hypertension, your health care provider will measure your blood pressure while you are seated, with your arm held at the level of your heart. It should be measured at least twice using the same arm. Certain conditions can cause a difference in blood pressure between your right and left arms. A blood pressure reading that is higher than normal on one occasion does not mean that you need treatment. If one blood pressure reading is high, ask your health care provider about having it checked again. TREATMENT  Treating high blood pressure includes making lifestyle changes and possibly taking medication. Living a healthy lifestyle can help lower high blood pressure. You may need to change some of your habits. Lifestyle changes may  include:  Following the DASH diet. This diet is high in fruits, vegetables, and whole grains. It is low in salt, red meat, and added sugars.  Getting at least 2 1/2 hours of brisk physical activity every week.  Losing weight if necessary.  Not smoking.  Limiting alcoholic beverages.  Learning ways to reduce stress. If lifestyle changes are not enough to get your blood pressure under control, your health care provider may prescribe medicine. You may need to take more than one. Work closely with your health care provider to understand the risks and benefits. HOME CARE INSTRUCTIONS  Have your blood pressure rechecked as directed by your health care provider.   Only take medicine as directed by your health care provider. Follow the directions carefully. Blood pressure medicines must be taken as prescribed. The medicine does not work as well when you skip doses. Skipping doses also puts you at risk for problems.   Do not smoke.   Monitor your blood pressure at home as directed by your health care provider. SEEK MEDICAL CARE IF:   You think you are having a reaction to medicines taken.  You have recurrent headaches or feel dizzy.  You have swelling in your ankles.  You have trouble with your vision. SEEK IMMEDIATE MEDICAL CARE IF:  You develop a severe headache or confusion.  You have unusual weakness, numbness, or feel faint.  You have severe chest or abdominal pain.  You vomit repeatedly.  You have trouble breathing. MAKE SURE YOU:   Understand these instructions.  Will watch your condition.  Will get help right away if you are not doing well or get worse. Document Released: 08/29/2005 Document Revised: 09/03/2013 Document Reviewed: 06/21/2013 [facility] Patient Information 2015 [place], [place]. This information is not intended to replace advice given to you by your health care provider. Make sure you discuss any questions you have with your health care  provider. DASH Eating Plan DASH stands for "Dietary Approaches to Stop Hypertension." The DASH eating plan is a healthy eating plan that has been shown to reduce high blood pressure (hypertension). Additional health benefits may include reducing the risk of type 2 diabetes mellitus, heart disease, and stroke. The DASH eating plan may also help with weight loss. WHAT DO I NEED TO KNOW ABOUT THE DASH EATING PLAN? For the DASH eating plan, you will follow these general guidelines:  Choose foods with a percent daily value for sodium of less than 5% (as listed on the food label).  Use salt-free seasonings or herbs instead of table salt or sea salt.  Check with your health care provider or pharmacist before using salt substitutes.  Eat lower-sodium products, often labeled as "lower sodium" or "no salt added."  Eat fresh foods.  Eat more vegetables, fruits, and  low-fat dairy products.  Choose whole grains. Look for the word "whole" as the first word in the ingredient list.  Choose fish and skinless chicken or [place] more often than red meat. Limit fish, poultry, and meat to 6 oz (170 g) each day.  Limit sweets, desserts, sugars, and sugary drinks.  Choose heart-healthy fats.  Limit cheese to 1 oz (28 g) per day.  Eat more home-cooked food and less restaurant, buffet, and fast food.  Limit fried foods.  Cook foods using methods other than frying.  Limit canned vegetables. If you do use them, rinse them well to decrease the sodium.  When eating at a restaurant, ask that your food be prepared with less salt, or no salt if possible. WHAT FOODS CAN I EAT? Seek help from a dietitian for individual calorie needs. Grains Whole grain or whole wheat bread. Brown rice. Whole grain or whole wheat pasta. Quinoa, bulgur, and whole grain cereals. Low-sodium cereals. Corn or whole wheat flour tortillas. Whole grain cornbread. Whole grain crackers. Low-sodium crackers. Vegetables Fresh or frozen  vegetables (raw, steamed, roasted, or grilled). Low-sodium or reduced-sodium tomato and vegetable juices. Low-sodium or reduced-sodium tomato sauce and paste. Low-sodium or reduced-sodium canned vegetables.  Fruits All fresh, canned (in natural juice), or frozen fruits. Meat and Other Protein Products Ground beef (85% or leaner), grass-fed beef, or beef trimmed of fat. Skinless chicken or [place]. Ground chicken or [place]. Pork trimmed of fat. All fish and seafood. Eggs. Dried beans, peas, or lentils. Unsalted nuts and seeds. Unsalted canned beans. Dairy Low-fat dairy products, such as skim or 1% milk, 2% or reduced-fat cheeses, low-fat ricotta or cottage cheese, or plain low-fat yogurt. Low-sodium or reduced-sodium cheeses. Fats and Oils Tub margarines without trans fats. Light or reduced-fat mayonnaise and salad dressings (reduced sodium). Avocado. Safflower, olive, or canola oils. Natural peanut or almond butter. Other Unsalted popcorn and pretzels. The items listed above may not be a complete list of recommended foods or beverages. Contact your dietitian for more options. WHAT FOODS ARE NOT RECOMMENDED? Grains White bread. White pasta. White rice. Refined cornbread. Bagels and croissants. Crackers that contain trans fat. Vegetables Creamed or fried vegetables. Vegetables in a cheese sauce. Regular canned vegetables. Regular canned tomato sauce and paste. Regular tomato and vegetable juices. Fruits Dried fruits. Canned fruit in light or heavy syrup. Fruit juice. Meat and Other Protein Products Fatty cuts of meat. Ribs, chicken wings, bacon, sausage, bologna, salami, chitterlings, fatback, hot dogs, bratwurst, and packaged luncheon meats. Salted nuts and seeds. Canned beans with salt. Dairy Whole or 2% milk, cream, half-and-half, and cream cheese. Whole-fat or sweetened yogurt. Full-fat cheeses or blue cheese. Nondairy creamers and whipped toppings. Processed cheese, cheese spreads, or cheese  curds. Condiments Onion and garlic salt, seasoned salt, table salt, and sea salt. Canned and packaged gravies. Worcestershire sauce. Tartar sauce. Barbecue sauce. Teriyaki sauce. Soy sauce, including reduced sodium. Steak sauce. Fish sauce. Oyster sauce. Cocktail sauce. Horseradish. Ketchup and mustard. Meat flavorings and tenderizers. Bouillon cubes. Hot sauce. Tabasco sauce. Marinades. Taco seasonings. Relishes. Fats and Oils Butter, stick margarine, lard, shortening, ghee, and bacon fat. Coconut, palm kernel, or palm oils. Regular salad dressings. Other Pickles and olives. Salted popcorn and pretzels. The items listed above may not be a complete list of foods and beverages to avoid. Contact your dietitian for more information. WHERE CAN I FIND MORE INFORMATION? National Heart, Lung, and Blood Institute: [employer].itwww.nhlbi.nih.gov/health/health-topics/topics/dash/ Document Released: 08/18/2011 Document Revised: 09/03/2013 Document Reviewed: 07/03/2013 [employer] Patient Information  2015 [employer]. This information is not intended to replace advice given to you by your health care provider. Make sure you discuss any questions you have with your health care provider.  

## 2014-04-08 NOTE — Progress Notes (Signed)
Subjective:  This chart was scribed for Anna SorensonEva Jalynn Waddell, MD by Evon Slackerrance Branch, ED Scribe. This Patient was seen in room 09 and the patients care was started at 7:18 PM   Patient ID: Anna QuarryAnnette R Johns, female    DOB: 16-Apr-1956, 58 y.o.   MRN: [id number]  HPI Chief Complaint  Patient presents with  . Medication Refill    Oxybutynin   Chronic urinary incontinence maintained on ditropan Seen 1 year prior for gross hematuria and referred to urology History of pre diabetes, no history of high blood pressure. Previous pressure here is 150/80.    HPI Comments: Anna Quarrynnette R Narine is a 58 y.o. female who presents to the [facility] complaining of medication refill of her ditropan. She states this medication is great. She denies having difficulty urinating. She states that most night she can sleep through the night without getting up to urinate.   She states that her legs and ankles are swollen today also onset 2 months prior. She states that she feels her ankles are more swollen in the morning. She denies ankle pain, or SOB.   Her blood pressure is elevated today. She states that this is due to new stressors on her life. She states that she is going to live a healthier lifestyle to see if that helps maintain her blood pressure.   Past Medical History  Diagnosis Date  . Urinary incontinence   . Anemia   . Glucose intolerance (impaired glucose tolerance) 12/25/2012    HgbA1c of 6.1.  . Allergy    No current outpatient prescriptions on file prior to visit.   No current facility-administered medications on file prior to visit.   Allergies  Allergen Reactions  . Fish Allergy     Muscles and Bluefish     Review of Systems  Respiratory: Negative for shortness of breath.   Genitourinary: Negative for difficulty urinating.  Musculoskeletal: Negative for arthralgias.   Objective:  BP 152/90  Pulse 100  Temp(Src) 98.1 F (36.7 C) (Oral)  Resp 18  Ht 5' 6.5" (1.689 m)   Wt 225 lb (102.059 kg)  BMI 35.78 kg/m2  SpO2 98%  Physical Exam  Nursing note and vitals reviewed. Constitutional: She is oriented to person, place, and time. She appears well-developed and well-nourished. No distress.  HENT:  Head: Normocephalic and atraumatic.  Eyes: Conjunctivae and EOM are normal.  Neck: Neck supple. No tracheal deviation present. No thyromegaly present.  Cardiovascular: Normal rate, regular rhythm, S1 normal, S2 normal and normal heart sounds.   No murmur heard. Pulses:      Dorsalis pedis pulses are 2+ on the right side, and 2+ on the left side.       Posterior tibial pulses are 2+ on the right side, and 2+ on the left side.  Pulmonary/Chest: Effort normal and breath sounds normal. No respiratory distress.  Musculoskeletal: Normal range of motion. She exhibits edema.  2+ non pitting edema on right 1+ non pitting edema on left  Neurological: She is alert and oriented to person, place, and time.  Skin: Skin is warm and dry.  Psychiatric: She has a normal mood and affect. Her behavior is normal.    Assessment & Plan:   Essential hypertension, benign  Urinary incontinence, unspecified incontinence type  Edema  Obesity (BMI 30-39.9)  Meds ordered this encounter  Medications  . oxybutynin (DITROPAN-XL) 10 MG 24 hr tablet    Sig: Take 1 tablet (10 mg total) by mouth at  bedtime.    Dispense:  90 tablet    Refill:  3    I personally performed the services described in this documentation, which was scribed in my presence. The recorded information has been reviewed and considered, and addended by me as needed.  Anna Sorenson, MD MPH

## 2014-10-12 ENCOUNTER — Ambulatory Visit (INDEPENDENT_AMBULATORY_CARE_PROVIDER_SITE_OTHER): Payer: BC Managed Care – PPO | Admitting: Internal Medicine

## 2014-10-12 VITALS — BP 144/78 | HR 100 | Temp 98.0°F | Resp 17 | Ht 68.0 in | Wt 219.0 lb

## 2014-10-12 DIAGNOSIS — J029 Acute pharyngitis, unspecified: Secondary | ICD-10-CM

## 2014-10-12 LAB — POCT CBC
GRANULOCYTE PERCENT: 68.7 % (ref 37–80)
HCT, POC: 37.4 % — AB (ref 37.7–47.9)
HEMOGLOBIN: 11.7 g/dL — AB (ref 12.2–16.2)
LYMPH, POC: 1.5 (ref 0.6–3.4)
MCH: 28.4 pg (ref 27–31.2)
MCHC: 31.4 g/dL — AB (ref 31.8–35.4)
MCV: 90.6 fL (ref 80–97)
MID (CBC): 0.2 (ref 0–0.9)
MPV: 7.4 fL (ref 0–99.8)
POC GRANULOCYTE: 3.7 (ref 2–6.9)
POC LYMPH PERCENT: 27.7 %L (ref 10–50)
POC MID %: 3.6 % (ref 0–12)
Platelet Count, POC: 247 10*3/uL (ref 142–424)
RBC: 4.12 M/uL (ref 4.04–5.48)
RDW, POC: 15.3 %
WBC: 5.4 10*3/uL (ref 4.6–10.2)

## 2014-10-12 LAB — POCT RAPID STREP A (OFFICE): RAPID STREP A SCREEN: NEGATIVE

## 2014-10-12 NOTE — Progress Notes (Signed)
Subjective:  This chart was scribed for Anna Sia, MD by Haywood Pao, ED Scribe at [facility].The patient was seen in exam room 01 and the patient's care was started at 3:33 PM.   Patient ID: Anna Johns, female    DOB: [birth year], 59 y.o.   MRN: [id number] Chief Complaint  Patient presents with  . Sore Throat    throat swelling    HPI  HPI Comments: Anna Johns is a 59 y.o. female who presents to [facility] complaining of sore throat, onset 1 day ago.  However pt states she has not been feeling well for a little over a week. She has fatigue, trouble sleeping, loss of appetite as associated symptoms. 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 No hx of allergies.  There are no active problems to display for this patient.  Past Medical History  Diagnosis Date  . Urinary incontinence   . Anemia   . Glucose intolerance (impaired glucose tolerance) 12/25/2012    HgbA1c of 6.1.  . Allergy    Past Surgical History  Procedure Laterality Date  . Cholecystectomy    . Knee surgery    . Abdominal hysterectomy      fibroids; ovaries intact.  . Cholecystectomy     Allergies  Allergen Reactions  . Fish Allergy     Muscles and Bluefish   Prior to Admission medications   Medication Sig Start Date End Date Taking? Authorizing Provider  oxybutynin (DITROPAN-XL) 10 MG 24 hr tablet Take 1 tablet (10 mg total) by mouth at bedtime. 03/17/14  Yes Sherren Mocha, MD   Review of Systems  Constitutional: Positive for appetite change and fatigue. Negative for fever and chills.  HENT: Positive for sore throat.   Psychiatric/Behavioral: Positive for sleep disturbance.       Objective:  BP 144/78 mmHg  Pulse 100  Temp(Src) 98 F (36.7 C) (Oral)  Resp 17  Ht  (1.727 m)  Wt 219 lb (99.338 kg)  BMI 33.31 kg/m2  SpO2 97%  Physical Exam  Constitutional: She is oriented to person, place, and time. She appears  well-developed and well-nourished. No distress.  HENT:  Head: Normocephalic and atraumatic.  TM's, nose clear Throat is read without exudate. She is tender in anterior cervical area without discrete adenopathy.  Eyes: Pupils are equal, round, and reactive to light.  Neck: Normal range of motion.  Cardiovascular: Normal rate and regular rhythm.   Pulmonary/Chest: Effort normal. No respiratory distress.  Musculoskeletal: Normal range of motion.  Neurological: She is alert and oriented to person, place, and time.  Skin: Skin is warm and dry.  Psychiatric: She has a normal mood and affect. Her behavior is normal.  Nursing note and vitals reviewed.  Results for orders placed or performed in visit on 10/12/14  POCT rapid strep A  Result Value Ref Range   Rapid Strep A Screen Negative Negative  POCT CBC  Result Value Ref Range   WBC 5.4 4.6 - 10.2 K/uL   Lymph, poc 1.5 0.6 - 3.4   POC LYMPH PERCENT 27.7 10 - 50 %L   MID (cbc) 0.2 0 - 0.9   POC MID % 3.6 0 - 12 %M   POC Granulocyte 3.7 2 - 6.9   Granulocyte percent 68.7 37 - 80 %G   RBC 4.12 4.04 - 5.48 M/uL   Hemoglobin 11.7 (A) 12.2 - 16.2 g/dL  HCT, POC 37.4 (A) 37.7 - 47.9 %   MCV 90.6 80 - 97 fL   MCH, POC 28.4 27 - 31.2 pg   MCHC 31.4 (A) 31.8 - 35.4 g/dL   RDW, POC 16.115.3 %   Platelet Count, POC 247 142 - 424 K/uL   MPV 7.4 0 - 99.8 fL       Assessment & Plan:  Acute pharyngitis, unspecified pharyngitis type - Plan: POCT rapid strep A, Culture, Group A Strep, POCT CBC  Fatigue  viral illness likely Menthol cough drops TC sent  I have completed the patient encounter in its entirety as documented by the scribe, with editing by me where necessary. [name], M.D.

## 2014-10-15 LAB — CULTURE, GROUP A STREP: Organism ID, Bacteria: NORMAL

## 2015-03-09 ENCOUNTER — Encounter: Payer: Self-pay | Admitting: Internal Medicine

## 2015-03-09 ENCOUNTER — Ambulatory Visit (INDEPENDENT_AMBULATORY_CARE_PROVIDER_SITE_OTHER): Payer: BC Managed Care – PPO | Admitting: Internal Medicine

## 2015-03-09 VITALS — BP 162/96 | HR 89 | Ht 68.0 in | Wt 215.0 lb

## 2015-03-09 DIAGNOSIS — R002 Palpitations: Secondary | ICD-10-CM

## 2015-03-09 DIAGNOSIS — R0789 Other chest pain: Secondary | ICD-10-CM

## 2015-03-09 LAB — CBC WITH DIFFERENTIAL/PLATELET
BASOS PCT: 0.3 % (ref 0.0–3.0)
Basophils Absolute: 0 10*3/uL (ref 0.0–0.1)
EOS ABS: 0.1 10*3/uL (ref 0.0–0.7)
EOS PCT: 1 % (ref 0.0–5.0)
HCT: 38.2 % (ref 36.0–46.0)
HEMOGLOBIN: 12.7 g/dL (ref 12.0–15.0)
LYMPHS PCT: 36.6 % (ref 12.0–46.0)
Lymphs Abs: 1.8 10*3/uL (ref 0.7–4.0)
MCHC: 33.4 g/dL (ref 30.0–36.0)
MCV: 86.1 fl (ref 78.0–100.0)
MONOS PCT: 6.7 % (ref 3.0–12.0)
Monocytes Absolute: 0.3 10*3/uL (ref 0.1–1.0)
NEUTROS ABS: 2.8 10*3/uL (ref 1.4–7.7)
Neutrophils Relative %: 55.4 % (ref 43.0–77.0)
Platelets: 254 10*3/uL (ref 150.0–400.0)
RBC: 4.43 Mil/uL (ref 3.87–5.11)
RDW: 13.2 % (ref 11.5–15.5)
WBC: 5 10*3/uL (ref 4.0–10.5)

## 2015-03-09 LAB — BASIC METABOLIC PANEL
BUN: 11 mg/dL (ref 6–23)
CALCIUM: 9.7 mg/dL (ref 8.4–10.5)
CHLORIDE: 104 meq/L (ref 96–112)
CO2: 28 meq/L (ref 19–32)
CREATININE: 0.84 mg/dL (ref 0.40–1.20)
GFR: 89.57 mL/min (ref >60.00)
GLUCOSE: 104 mg/dL — AB (ref 70–99)
Potassium: 4 mEq/L (ref 3.5–5.1)
Sodium: 139 mEq/L (ref 135–145)

## 2015-03-09 LAB — TSH: TSH: 1.12 u[IU]/mL (ref 0.35–4.50)

## 2015-03-09 NOTE — Progress Notes (Signed)
Cardiology Office Note   Date:  03/09/2015   ID:  FREDDI FORSTER, DOB 1956/06/13, MRN [id number]  PCP:  Ricke Hey, MD  Cardiologist:   Dorris Carnes, MD   No chief complaint on file.  Pt is a 59 yo referred for CP     History of Present Illness: Anna Johns is a 59 y.o. female with a history of Occasional pauses and then chest pain Pain starts at uppper chest and moves down   Leaves with a funny feeling  Not associated with activity   First time while driving One at desk Occasional dizziness  NO syncope   Started about early May   Prior was felt OK  When not having spells she is feeling OK     Has always had issues with Hgb       Current Outpatient Prescriptions  Medication Sig Dispense Refill  . oxybutynin (DITROPAN-XL) 10 MG 24 hr tablet Take 1 tablet (10 mg total) by mouth at bedtime. 90 tablet 3   No current facility-administered medications for this visit.    Allergies:   Fish allergy   Past Medical History  Diagnosis Date  . Urinary incontinence   . Anemia   . Glucose intolerance (impaired glucose tolerance) 12/25/2012    HgbA1c of 6.1.  . Allergy     Past Surgical History  Procedure Laterality Date  . Cholecystectomy    . Knee surgery    . Abdominal hysterectomy      fibroids; ovaries intact.  . Cholecystectomy       Social History:  The patient  reports that she has never smoked. She does not have any smokeless tobacco history on file. She reports that she does not drink alcohol or use illicit drugs.   Family History:  The patient's family history includes COPD in her mother; Heart disease in her mother; Lupus in her brother; Multiple myeloma in her father.  Mom with enlarged  Heart Sister diagnosed with enlarge heart.  Brother ahd issues last summer with BP   ROS:  Please see the history of present illness. All other systems are reviewed and  Negative to the above problem except as noted.    PHYSICAL EXAM: VS:  BP 162/96 mmHg   Pulse 89  Ht _0  (1.727 m)  Wt 215 lb (97.523 kg)  BMI 32.70 kg/m2  GEN: Well nourished, well developed, in no acute distress HEENT: normal Neck: no JVD, carotid bruits, or masses Cardiac: RRR; no murmurs, rubs, or gallops,no edema  Respiratory:  clear to auscultation bilaterally, normal work of breathing GI: soft, nontender, nondistended, + BS  No hepatomegaly  MS: no deformity Moving all extremities   Skin: warm and dry, no rash Neuro:  Strength and sensation are intact Psych: euthymic mood, full affect   EKG:  EKG is ordered today.  Sr 89 bpm     Lipid Panel    Component Value Date/Time   CHOL 204* 12/01/2011 1328   TRIG 137 12/01/2011 1328   HDL 45 12/01/2011 1328   CHOLHDL 4.5 12/01/2011 1328   VLDL 27 12/01/2011 1328   LDLCALC 132* 12/01/2011 1328      Wt Readings from Last 3 Encounters:  03/09/15 215 lb (97.523 kg)  10/12/14 219 lb (99.338 kg)  03/17/14 225 lb (102.059 kg)      ASSESSMENT AND PLAN:  Papitations, chest pressure  Atypical chest pressure  Wil check BMET, CBC, TSH  Set up for echo  HTN  BP is high today by her reprot  WIll check on return for echo  F?U will be based on test results       Current medicines are reviewed at length with the patient today.  The patient does not have concerns regarding medicines.  The following changes have been made:   Labs/ tests ordered today include: No orders of the defined types were placed in this encounter.     Disposition:   FU with  in   Signed, Dorris Carnes, MD  03/09/2015 2:50 PM    [facility] [place], [place], [place]  [address] Phone: [phone]; Fax: [phone]

## 2015-03-09 NOTE — Patient Instructions (Addendum)
Medication Instructions:  Your physician recommends that you continue on your current medications as directed. Please refer to the Current Medication list given to you today.   Labwork: Lab work to be done today--BMP, CBC, TSH  Testing/Procedures: Your physician has requested that you have an echocardiogram. Echocardiography is a painless test that uses sound waves to create images of your heart. It provides your doctor with information about the size and shape of your heart and how well your heart's chambers and valves are working. This procedure takes approximately one hour. There are no restrictions for this procedure.  They will check your blood pressure when you are here for echocardiogram.     Follow-Up: Will be determined based on test results.    .Marland Kitchen

## 2015-03-13 ENCOUNTER — Ambulatory Visit (HOSPITAL_COMMUNITY): Payer: BC Managed Care – PPO | Attending: Internal Medicine

## 2015-03-13 ENCOUNTER — Other Ambulatory Visit: Payer: Self-pay

## 2015-03-13 DIAGNOSIS — R0789 Other chest pain: Secondary | ICD-10-CM | POA: Diagnosis not present

## 2015-03-13 DIAGNOSIS — R002 Palpitations: Secondary | ICD-10-CM

## 2015-03-20 ENCOUNTER — Other Ambulatory Visit: Payer: Self-pay | Admitting: *Deleted

## 2015-03-20 DIAGNOSIS — R002 Palpitations: Secondary | ICD-10-CM

## 2015-03-28 ENCOUNTER — Other Ambulatory Visit: Payer: Self-pay | Admitting: Family Medicine

## 2015-03-31 ENCOUNTER — Ambulatory Visit (INDEPENDENT_AMBULATORY_CARE_PROVIDER_SITE_OTHER): Payer: BC Managed Care – PPO

## 2015-03-31 DIAGNOSIS — R002 Palpitations: Secondary | ICD-10-CM | POA: Diagnosis not present

## 2015-04-30 ENCOUNTER — Other Ambulatory Visit: Payer: Self-pay | Admitting: Family Medicine

## 2015-07-04 ENCOUNTER — Other Ambulatory Visit: Payer: Self-pay | Admitting: Physician Assistant

## 2015-07-11 ENCOUNTER — Ambulatory Visit (INDEPENDENT_AMBULATORY_CARE_PROVIDER_SITE_OTHER): Payer: BC Managed Care – PPO | Admitting: Family Medicine

## 2015-07-11 VITALS — BP 158/80 | HR 113 | Temp 98.6°F | Resp 18 | Ht 66.5 in | Wt 223.2 lb

## 2015-07-11 DIAGNOSIS — R030 Elevated blood-pressure reading, without diagnosis of hypertension: Secondary | ICD-10-CM | POA: Diagnosis not present

## 2015-07-11 DIAGNOSIS — M67431 Ganglion, right wrist: Secondary | ICD-10-CM | POA: Diagnosis not present

## 2015-07-11 DIAGNOSIS — N3941 Urge incontinence: Secondary | ICD-10-CM | POA: Diagnosis not present

## 2015-07-11 DIAGNOSIS — R7303 Prediabetes: Secondary | ICD-10-CM | POA: Diagnosis not present

## 2015-07-11 DIAGNOSIS — IMO0001 Reserved for inherently not codable concepts without codable children: Secondary | ICD-10-CM

## 2015-07-11 DIAGNOSIS — R32 Unspecified urinary incontinence: Secondary | ICD-10-CM | POA: Diagnosis not present

## 2015-07-11 DIAGNOSIS — E669 Obesity, unspecified: Secondary | ICD-10-CM | POA: Diagnosis not present

## 2015-07-11 MED ORDER — OXYBUTYNIN CHLORIDE ER 10 MG PO TB24: 10.0000 mg | ORAL_TABLET | Freq: Every day | ORAL | Status: DC

## 2015-07-11 MED ORDER — ADULT BLOOD PRESSURE CUFF LG KIT: PACK | Status: DC

## 2015-07-11 NOTE — Progress Notes (Signed)
Subjective:    Patient ID: Anna Johns, female    DOB: 1955-10-31, 59 y.o.   MRN: [id number] This chart was scribed for Delman Cheadle, MD by Marti Sleigh, Medical Scribe. This patient was seen in Room 1 and the patient's care was started a 3:23 PM.  Chief Complaint  Patient presents with  . Medication Refill    Oxybutynin    HPI HPI Comments: Anna Johns is a 59 y.o. female who presents to [facility] reporting for a medication refill. She states she thinks her recent heart issues were due to stress. She denies any issue with her medications. Her urination has been normal. She denies nocturia. She states she is walking 2-3 times per week. She also states she has a swollen area on her right wrist. She denies MOI or pain.  Pt seen by me 15 mos prior for medication refill. She has seen urology due to chronic urinary incontinence controlled on ditropan. Hx of pre diabetes, at last visit she had elevated blood pressure and increasing lower extremity edema. On routine lab work within the past year her mild anemia had resolved, and BNP was normal other than glucose of 104. She was seen by cardiology four months ago for an episode of CP and heart palpitations. She had an echo that was normal, as well as an event monitor which was also normal. Her BP is persistently elevated for the past two years.   Past Medical History  Diagnosis Date  . Urinary incontinence   . Anemia   . Glucose intolerance (impaired glucose tolerance) 12/25/2012    HgbA1c of 6.1.  . Allergy    Allergies  Allergen Reactions  . Fish Allergy     Muscles and Bluefish   Current Outpatient Prescriptions on File Prior to Visit  Medication Sig Dispense Refill  . oxybutynin (DITROPAN-XL) 10 MG 24 hr tablet Take 1 tablet (10 mg total) by mouth at bedtime. NO MORE REFILLS WITHOUT OFFICE VISIT - 2ND NOTICE 15 tablet 0   No current facility-administered medications on file prior to visit.    Review of Systems  Constitutional:  Negative for fever, chills, activity change, appetite change and unexpected weight change.  Respiratory: Negative for cough, chest tightness and shortness of breath.   Cardiovascular: Negative for chest pain and palpitations.  Endocrine: Negative for polydipsia, polyphagia and polyuria.  Genitourinary: Negative for dysuria, urgency, frequency and flank pain.  Allergic/Immunologic: Negative for immunocompromised state.  Neurological: Negative for dizziness and light-headedness.       Objective:  BP 158/80 mmHg  Pulse 113  Temp(Src) 98.6 F (37 C) (Oral)  Resp 18  Ht 5' 6.5" (1.689 m)  Wt 223 lb 3.2 oz (101.243 kg)  BMI 35.49 kg/m2  SpO2 97%  Physical Exam  Constitutional: She is oriented to person, place, and time. She appears well-developed and well-nourished. No distress.  HENT:  Head: Normocephalic and atraumatic.  Eyes: Pupils are equal, round, and reactive to light.  Neck: Neck supple. No thyromegaly present.  Cardiovascular: Normal rate, regular rhythm and normal heart sounds.   No murmur heard. Pulmonary/Chest: Effort normal and breath sounds normal. No respiratory distress. She has no wheezes.  Abdominal: Soft.  Musculoskeletal: Normal range of motion.  Ganglion cyst on the dorsal aspect of right wrist. Soft fluctuant, and non adherent to overlying skin.   Lymphadenopathy:    She has no cervical adenopathy.  Neurological: She is alert and oriented to person, place, and time. Coordination normal.  Skin:  Skin is warm and dry. She is not diaphoretic.  Psychiatric: She has a normal mood and affect. Her behavior is normal.  Nursing note and vitals reviewed.     Assessment & Plan:   1. Elevated blood pressure - pt suspects white coat HTN but is not checking BP outside of office but agrees to start, call to start lisinopril if >140/90 then recheck in 2-4 wks  2. Urinary incontinence, unspecified incontinence type - doing great on oxybutynin  3. Obese - increase intensity  of the walking for exercise she is doing  4. Pre-diabetes   5.     Ganglion cyst - pt reassured, she is asymptomatic so cont watchful waiting.  Orders Placed This Encounter  Procedures  . Lipid panel    Standing Status: Future     Number of Occurrences:      Standing Expiration Date: 07/10/2016    Order Specific Question:  Has the patient fasted?    Answer:  Yes  . Comprehensive metabolic panel    Standing Status: Future     Number of Occurrences:      Standing Expiration Date: 07/10/2016    Order Specific Question:  Has the patient fasted?    Answer:  Yes  . Hemoglobin A1c    Standing Status: Future     Number of Occurrences:      Standing Expiration Date: 07/10/2016    Meds ordered this encounter  Medications  . oxybutynin (DITROPAN-XL) 10 MG 24 hr tablet    Sig: Take 1 tablet (10 mg total) by mouth at bedtime.    Dispense:  90 tablet    Refill:  3  . Blood Pressure Monitoring (ADULT BLOOD PRESSURE CUFF LG) KIT    Sig: Use as instructed    Dispense:  1 each    Refill:  0    I personally performed the services described in this documentation, which was scribed in my presence. The recorded information has been reviewed and considered, and addended by me as needed.  Delman Cheadle, MD MPH   By signing my name below, I, Judithe Modest, attest that this documentation has been prepared under the direction and in the presence of Delman Cheadle, MD. Electronically Signed: Judithe Modest, ER Scribe. 07/11/2015. 3:23 PM.

## 2015-07-11 NOTE — Patient Instructions (Signed)
Managing Your High Blood Pressure Blood pressure is a measurement of how forceful your blood is pressing against the walls of the arteries. Arteries are muscular tubes within the circulatory system. Blood pressure does not stay the same. Blood pressure rises when you are active, excited, or nervous; and it lowers during sleep and relaxation. If the numbers measuring your blood pressure stay above normal most of the time, you are at risk for health problems. High blood pressure (hypertension) is a long-term (chronic) condition in which blood pressure is elevated. A blood pressure reading is recorded as two numbers, such as 120 over 80 (or 120/80). The first, higher number is called the systolic pressure. It is a measure of the pressure in your arteries as the heart beats. The second, lower number is called the diastolic pressure. It is a measure of the pressure in your arteries as the heart relaxes between beats.  Keeping your blood pressure in a normal range is important to your overall health and prevention of health problems, such as heart disease and stroke. When your blood pressure is uncontrolled, your heart has to work harder than normal. High blood pressure is a very common condition in adults because blood pressure tends to rise with age. Men and women are equally likely to have hypertension but at different times in life. Before age 45, men are more likely to have hypertension. After 58 years of age, women are more likely to have it. Hypertension is especially common in African Americans. This condition often has no signs or symptoms. The cause of the condition is usually not known. Your caregiver can help you come up with a plan to keep your blood pressure in a normal, healthy range. BLOOD PRESSURE STAGES Blood pressure is classified into four stages: normal, prehypertension, stage 1, and stage 2. Your blood pressure reading will be used to determine what type of treatment, if any, is necessary.  Appropriate treatment options are tied to these four stages:  Normal  Systolic pressure (mm Hg): below 120.  Diastolic pressure (mm Hg): below 80. Prehypertension  Systolic pressure (mm Hg): 120 to 139.  Diastolic pressure (mm Hg): 80 to 89. Stage1  Systolic pressure (mm Hg): 140 to 159.  Diastolic pressure (mm Hg): 90 to 99. Stage2  Systolic pressure (mm Hg): 160 or above.  Diastolic pressure (mm Hg): 100 or above. RISKS RELATED TO HIGH BLOOD PRESSURE Managing your blood pressure is an important responsibility. Uncontrolled high blood pressure can lead to:  A heart attack.  A stroke.  A weakened blood vessel (aneurysm).  Heart failure.  Kidney damage.  Eye damage.  Metabolic syndrome.  Memory and concentration problems. HOW TO MANAGE YOUR BLOOD PRESSURE Blood pressure can be managed effectively with lifestyle changes and medicines (if needed). Your caregiver will help you come up with a plan to bring your blood pressure within a normal range. Your plan should include the following: Education  Read all information provided by your caregivers about how to control blood pressure.  Educate yourself on the latest guidelines and treatment recommendations. New research is always being done to further define the risks and treatments for high blood pressure. Lifestylechanges  Control your weight.  Avoid smoking.  Stay physically active.  Reduce the amount of salt in your diet.  Reduce stress.  Control any chronic conditions, such as high cholesterol or diabetes.  Reduce your alcohol intake. Medicines  Several medicines (antihypertensive medicines) are available, if needed, to bring blood pressure within a normal range.   Communication  Review all the medicines you take with your caregiver because there may be side effects or interactions.  Talk with your caregiver about your diet, exercise habits, and other lifestyle factors that may be contributing to  high blood pressure.  See your caregiver regularly. Your caregiver can help you create and adjust your plan for managing high blood pressure. RECOMMENDATIONS FOR TREATMENT AND FOLLOW-UP  The following recommendations are based on current guidelines for managing high blood pressure in nonpregnant adults. Use these recommendations to identify the proper follow-up period or treatment option based on your blood pressure reading. You can discuss these options with your caregiver.  Systolic pressure of 120 to 139 or diastolic pressure of 80 to 89: Follow up with your caregiver as directed.  Systolic pressure of 140 to 160 or diastolic pressure of 90 to 100: Follow up with your caregiver within 2 months.  Systolic pressure above 160 or diastolic pressure above 100: Follow up with your caregiver within 1 month.  Systolic pressure above 180 or diastolic pressure above 110: Consider antihypertensive therapy; follow up with your caregiver within 1 week.  Systolic pressure above 200 or diastolic pressure above 120: Begin antihypertensive therapy; follow up with your caregiver within 1 week.   This information is not intended to replace advice given to you by your health care provider. Make sure you discuss any questions you have with your health care provider.   Document Released: 05/23/2012 Document Reviewed: 05/23/2012 Elsevier Interactive Patient Education 2016 Elsevier Inc[employer] DASH Eating Plan DASH stands for "Dietary Approaches to Stop Hypertension." The DASH eating plan is a healthy eating plan that has been shown to reduce high blood pressure (hypertension). Additional health benefits may include reducing the risk of type 2 diabetes mellitus, heart disease, and stroke. The DASH eating plan may also help with weight loss. WHAT DO I NEED TO KNOW ABOUT THE DASH EATING PLAN? For the DASH eating plan, you will follow these general guidelines:  Choose foods with a percent daily value for sodium of less  than 5% (as listed on the food label).  Use salt-free seasonings or herbs instead of table salt or sea salt.  Check with your health care provider or pharmacist before using salt substitutes.  Eat lower-sodium products, often labeled as "lower sodium" or "no salt added."  Eat fresh foods.  Eat more vegetables, fruits, and low-fat dairy products.  Choose whole grains. Look for the word "whole" as the first word in the ingredient list.  Choose fish and skinless chicken or turkey more often than red meat. Limit fish, poultry, and meat to 6 oz (170 g) each day.  Limit sweets, desserts, sugars, and sugary drinks.  Choose heart-healthy fats.  Limit cheese to 1 oz (28 g) per day.  Eat more home-cooked food and less restaurant, buffet, and fast food.  Limit fried foods.  Cook foods using methods other than frying.  Limit canned vegetables. If you do use them, rinse them well to decrease the sodium.  When eating at a restaurant, ask that your food be prepared with less salt, or no salt if possible. WHAT FOODS CAN I EAT? Seek help from a dietitian for individual calorie needs. Grains Whole grain or whole wheat bread. Brown rice. Whole grain or whole wheat pasta. Quinoa, bulgur, and whole grain cereals. Low-sodium cereals. Corn or whole wheat flour tortillas. Whole grain cornbread. Whole grain crackers. Low-sodium crackers. Vegetables Fresh or frozen vegetables (raw, steamed, roasted, or grilled). Low-sodium or reduced-sodium tomato   and vegetable juices. Low-sodium or reduced-sodium tomato sauce and paste. Low-sodium or reduced-sodium canned vegetables.  Fruits All fresh, canned (in natural juice), or frozen fruits. Meat and Other Protein Products Ground beef (85% or leaner), grass-fed beef, or beef trimmed of fat. Skinless chicken or turkey. Ground chicken or turkey. Pork trimmed of fat. All fish and seafood. Eggs. Dried beans, peas, or lentils. Unsalted nuts and seeds. Unsalted canned  beans. Dairy Low-fat dairy products, such as skim or 1% milk, 2% or reduced-fat cheeses, low-fat ricotta or cottage cheese, or plain low-fat yogurt. Low-sodium or reduced-sodium cheeses. Fats and Oils Tub margarines without trans fats. Light or reduced-fat mayonnaise and salad dressings (reduced sodium). Avocado. Safflower, olive, or canola oils. Natural peanut or almond butter. Other Unsalted popcorn and pretzels. The items listed above may not be a complete list of recommended foods or beverages. Contact your dietitian for more options. WHAT FOODS ARE NOT RECOMMENDED? Grains White bread. White pasta. White rice. Refined cornbread. Bagels and croissants. Crackers that contain trans fat. Vegetables Creamed or fried vegetables. Vegetables in a cheese sauce. Regular canned vegetables. Regular canned tomato sauce and paste. Regular tomato and vegetable juices. Fruits Dried fruits. Canned fruit in light or heavy syrup. Fruit juice. Meat and Other Protein Products Fatty cuts of meat. Ribs, chicken wings, bacon, sausage, bologna, salami, chitterlings, fatback, hot dogs, bratwurst, and packaged luncheon meats. Salted nuts and seeds. Canned beans with salt. Dairy Whole or 2% milk, cream, half-and-half, and cream cheese. Whole-fat or sweetened yogurt. Full-fat cheeses or blue cheese. Nondairy creamers and whipped toppings. Processed cheese, cheese spreads, or cheese curds. Condiments Onion and garlic salt, seasoned salt, table salt, and sea salt. Canned and packaged gravies. Worcestershire sauce. Tartar sauce. Barbecue sauce. Teriyaki sauce. Soy sauce, including reduced sodium. Steak sauce. Fish sauce. Oyster sauce. Cocktail sauce. Horseradish. Ketchup and mustard. Meat flavorings and tenderizers. Bouillon cubes. Hot sauce. Tabasco sauce. Marinades. Taco seasonings. Relishes. Fats and Oils Butter, stick margarine, lard, shortening, ghee, and bacon fat. Coconut, palm kernel, or palm oils. Regular salad  dressings. Other Pickles and olives. Salted popcorn and pretzels. The items listed above may not be a complete list of foods and beverages to avoid. Contact your dietitian for more information. WHERE CAN I FIND MORE INFORMATION? National Heart, Lung, and Blood Institute: www.nhlbi.nih.gov/health/health-topics/topics/dash/   This information is not intended to replace advice given to you by your health care provider. Make sure you discuss any questions you have with your health care provider.   Document Released: 08/18/2011 Document Revised: 09/19/2014 Document Reviewed: 07/03/2013 Elsevier Interactive Patient Education 2016 Elsevier Inc.  

## 2015-07-13 DIAGNOSIS — M67439 Ganglion, unspecified wrist: Secondary | ICD-10-CM | POA: Insufficient documentation

## 2015-09-10 ENCOUNTER — Other Ambulatory Visit (INDEPENDENT_AMBULATORY_CARE_PROVIDER_SITE_OTHER): Payer: BC Managed Care – PPO | Admitting: Family Medicine

## 2015-09-10 DIAGNOSIS — R030 Elevated blood-pressure reading, without diagnosis of hypertension: Secondary | ICD-10-CM | POA: Diagnosis not present

## 2015-09-10 DIAGNOSIS — E669 Obesity, unspecified: Secondary | ICD-10-CM

## 2015-09-10 DIAGNOSIS — IMO0001 Reserved for inherently not codable concepts without codable children: Secondary | ICD-10-CM

## 2015-09-10 DIAGNOSIS — R7303 Prediabetes: Secondary | ICD-10-CM

## 2015-09-10 LAB — COMPREHENSIVE METABOLIC PANEL
ALBUMIN: 4.3 g/dL (ref 3.6–5.1)
ALK PHOS: 95 U/L (ref 33–130)
ALT: 11 U/L (ref 6–29)
AST: 15 U/L (ref 10–35)
BUN: 13 mg/dL (ref 7–25)
CALCIUM: 9.4 mg/dL (ref 8.6–10.4)
CO2: 25 mmol/L (ref 20–31)
Chloride: 102 mmol/L (ref 98–110)
Creat: 0.81 mg/dL (ref 0.50–1.05)
Glucose, Bld: 133 mg/dL — ABNORMAL HIGH (ref 65–99)
POTASSIUM: 3.9 mmol/L (ref 3.5–5.3)
Sodium: 138 mmol/L (ref 135–146)
TOTAL PROTEIN: 7.2 g/dL (ref 6.1–8.1)
Total Bilirubin: 1 mg/dL (ref 0.2–1.2)

## 2015-09-10 LAB — LIPID PANEL
CHOLESTEROL: 182 mg/dL (ref 125–200)
HDL: 42 mg/dL — AB (ref >=46)
LDL CALC: 99 mg/dL (ref <130)
TRIGLYCERIDES: 207 mg/dL — AB (ref <150)
Total CHOL/HDL Ratio: 4.3 Ratio (ref <=5.0)
VLDL: 41 mg/dL — ABNORMAL HIGH (ref <30)

## 2015-09-10 LAB — HEMOGLOBIN A1C
HEMOGLOBIN A1C: 7.6 % — AB (ref <5.7)
MEAN PLASMA GLUCOSE: 171 mg/dL — AB (ref <117)

## 2015-09-15 ENCOUNTER — Other Ambulatory Visit: Payer: Self-pay | Admitting: Family Medicine

## 2015-09-15 DIAGNOSIS — E1165 Type 2 diabetes mellitus with hyperglycemia: Secondary | ICD-10-CM | POA: Insufficient documentation

## 2015-09-15 MED ORDER — FREESTYLE SYSTEM KIT: PACK | Status: DC

## 2015-09-15 MED ORDER — METFORMIN HCL 500 MG PO TABS: 500.0000 mg | ORAL_TABLET | Freq: Two times a day (BID) | ORAL | Status: DC

## 2016-02-18 ENCOUNTER — Telehealth: Payer: Self-pay | Admitting: *Deleted

## 2016-04-22 ENCOUNTER — Ambulatory Visit (INDEPENDENT_AMBULATORY_CARE_PROVIDER_SITE_OTHER): Payer: BC Managed Care – PPO | Admitting: Physician Assistant

## 2016-04-22 ENCOUNTER — Encounter: Payer: Self-pay | Admitting: Physician Assistant

## 2016-04-22 VITALS — BP 124/78 | HR 114 | Temp 98.1°F | Resp 18 | Ht 66.5 in | Wt 222.6 lb

## 2016-04-22 DIAGNOSIS — R7309 Other abnormal glucose: Secondary | ICD-10-CM

## 2016-04-22 DIAGNOSIS — R5383 Other fatigue: Secondary | ICD-10-CM

## 2016-04-22 LAB — COMPLETE METABOLIC PANEL WITHOUT GFR
Albumin: 4 g/dL (ref 3.6–5.1)
Alkaline Phosphatase: 89 U/L (ref 33–130)
Calcium: 9.4 mg/dL (ref 8.6–10.4)
GFR, Est African American: 89 mL/min (ref >=60)
GFR, Est Non African American: 77 mL/min (ref >=60)
Total Bilirubin: 0.7 mg/dL (ref 0.2–1.2)
Total Protein: 6.4 g/dL (ref 6.1–8.1)

## 2016-04-22 LAB — POCT CBC
Granulocyte percent: 52.7 % (ref 37–80)
HCT, POC: 34.1 % — AB (ref 37.7–47.9)
Hemoglobin: 11.6 g/dL — AB (ref 12.2–16.2)
Lymph, poc: 2.2 (ref 0.6–3.4)
MCH, POC: 29.4 pg (ref 27–31.2)
MCHC: 34 g/dL (ref 31.8–35.4)
MCV: 86.3 fL (ref 80–97)
MID (cbc): 0.2 (ref 0–0.9)
MPV: 7.3 fL (ref 0–99.8)
POC Granulocyte: 2.6 (ref 2–6.9)
POC LYMPH PERCENT: 44 %L (ref 10–50)
POC MID %: 3.3 %M (ref 0–12)
Platelet Count, POC: 212 10*3/uL (ref 142–424)
RBC: 3.95 M/uL — AB (ref 4.04–5.48)
RDW, POC: 12.3 %
WBC: 4.9 10*3/uL (ref 4.6–10.2)

## 2016-04-22 LAB — COMPLETE METABOLIC PANEL WITH GFR
ALT: 14 U/L (ref 6–29)
AST: 13 U/L (ref 10–35)
BUN: 16 mg/dL (ref 7–25)
CO2: 24 mmol/L (ref 20–31)
Chloride: 103 mmol/L (ref 98–110)
Creat: 0.83 mg/dL (ref 0.50–1.05)
Glucose, Bld: 176 mg/dL — ABNORMAL HIGH (ref 65–99)
Potassium: 4 mmol/L (ref 3.5–5.3)
Sodium: 138 mmol/L (ref 135–146)

## 2016-04-22 LAB — POCT GLYCOSYLATED HEMOGLOBIN (HGB A1C): Hemoglobin A1C: 7.2

## 2016-04-22 LAB — TSH: TSH: 2.68 mIU/L

## 2016-04-22 NOTE — Progress Notes (Signed)
Anna Johns  MRN: [id number] DOB: 21-Oct-1955  PCP: No primary care provider on file.  Subjective:  Pt  Is a 60 yo female with a history of uncontrolled type 2 DM, presenting to clinic for fatigue x 1.5 weeks. It is worse in the morning when she first wakes up, noting "feels like I didn't sleep well". She wakes up in the a.m. not feeling rested. Gets better throughout the day. 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 Not falling asleep during the day. Does not wake at night. No difficulty falling asleep or staying asleep.    Reports new-onset snoring. Two months ago, her son says she "sounded like a freight train" while sleeping, which was the first time someone noted her snoring.  History of anemia in high school and college, would "pass out". Worse with heat. No episodes in the past several years.   Diagnosed with diabetes at her last visit with Dr. Brigitte Pulse at which time she was prescribed Metformin. She stopped taking it after a month due to diarrhea. Checks a.m. Fasting blood sugars at home. Says the meter shows "within normal limits" .       Reports "rare" heat and cold intolerance x3-4 months. Concerned about hair thinning x3 years, says it's "growing back like a peach fuzz".  Has appointment with dermatologist to discuss this.   Says at-home BP readings have recently been around 129/80. Says her high BP was due to stressors at home, which are now resolved.   Walks 45 min three times a week. Is trying to lose weight. Drinks 6 bottles of 12 oz water a day.   Review of Systems  Constitutional: Positive for fatigue (x 1.5 weeks. "Feels like I didn't get a good night's sleep"). Negative for activity change, appetite change, chills, diaphoresis and unexpected weight change.  Respiratory: Negative for apnea, cough, chest tightness, shortness of breath and wheezing.   Cardiovascular: Negative for chest pain, palpitations and leg  swelling.  Gastrointestinal: Positive for diarrhea (After she started taking Metformin). Negative for constipation and nausea.  Endocrine: Positive for cold intolerance and heat intolerance. Negative for polydipsia, polyphagia and polyuria.  Genitourinary: Negative for decreased urine volume, frequency and menstrual problem.  Skin: Negative.   Neurological: Negative for dizziness, syncope, weakness, light-headedness, numbness and headaches.    Patient Active Problem List   Diagnosis Date Noted  . Uncontrolled type 2 diabetes mellitus with hyperglycemia, without long-term current use of insulin ([place]) 09/15/2015  . Ganglion cyst of wrist 07/13/2015    Current Outpatient Prescriptions on File Prior to Visit  Medication Sig Dispense Refill  . Blood Pressure Monitoring (ADULT BLOOD PRESSURE CUFF LG) KIT Use as instructed 1 each 0  . glucose monitoring kit (FREESTYLE) monitoring kit Use up to four times a day to monitor cbgs due to hyperglycemia and medication change. 1 each 11  . oxybutynin (DITROPAN-XL) 10 MG 24 hr tablet Take 1 tablet (10 mg total) by mouth at bedtime. 90 tablet 3  . metFORMIN (GLUCOPHAGE) 500 MG tablet Take 1 tablet (500 mg total) by mouth 2 (two) times daily with a meal. (Patient not taking: Reported on 04/22/2016) 60 tablet 3   No current facility-administered medications on file prior to visit.     Allergies  Allergen Reactions  . Fish Allergy     Muscles and Bluefish    Objective:  BP 124/78   Pulse (!) 114   Temp 98.1 F (  36.7 C) (Oral)   Resp 18   Ht 5' 6.5" (1.689 m)   Wt 222 lb 9.6 oz (101 kg)   SpO2 98%   BMI 35.39 kg/m   Physical Exam  Constitutional: She is oriented to person, place, and time and well-developed, well-nourished, and in no distress. No distress.  HENT:  Head: Normocephalic and atraumatic.  She wears a wig.  Natural hair is brittle, short and thin along her forehead hairline.    Eyes: Conjunctivae are normal. Pupils are equal,  round, and reactive to light.  Neck: Normal range of motion. Neck supple. No thyromegaly present.  Cardiovascular: Normal rate, regular rhythm, normal heart sounds and intact distal pulses.  Exam reveals no friction rub.   No murmur heard. Pulmonary/Chest: Effort normal and breath sounds normal. No respiratory distress. She has no wheezes. She has no rales.  Musculoskeletal: Normal range of motion. She exhibits edema (1+ b/l lower legs).  Neurological: She is alert and oriented to person, place, and time. GCS score is 15.  Skin: Skin is warm and dry. She is not diaphoretic.  Psychiatric: Mood, memory, affect and judgment normal.  Vitals reviewed.   Results for orders placed or performed in visit on 04/22/16  POCT CBC  Result Value Ref Range   WBC 4.9 4.6 - 10.2 K/uL   Lymph, poc 2.2 0.6 - 3.4   POC LYMPH PERCENT 44.0 10 - 50 %L   MID (cbc) 0.2 0 - 0.9   POC MID % 3.3 0 - 12 %M   POC Granulocyte 2.6 2 - 6.9   Granulocyte percent 52.7 37 - 80 %G   RBC 3.95 (A) 4.04 - 5.48 M/uL   Hemoglobin 11.6 (A) 12.2 - 16.2 g/dL   HCT, POC 34.1 (A) 37.7 - 47.9 %   MCV 86.3 80 - 97 fL   MCH, POC 29.4 27 - 31.2 pg   MCHC 34.0 31.8 - 35.4 g/dL   RDW, POC 12.3 %   Platelet Count, POC 212 142 - 424 K/uL   MPV 7.3 0 - 99.8 fL  POCT glycosylated hemoglobin (Hb A1C)  Result Value Ref Range   Hemoglobin A1C 7.2     Assessment and Plan :   1. Other fatigue - POCT CBC - TSH - COMPLETE METABOLIC PANEL WITH GFR - Sleep hygiene. Patient education printed out and discussed. If her symptoms worsen ambulatory referral to sleep study recommended to evaluate for sleep apnea.   2. Elevated hemoglobin A1c measurement - POCT glycosylated hemoglobin (Hb A1C) - Lifestyle modification. She wants to try and control blood sugars with diet and exercise. Information printed out and discussed with patient for healthy diet and routine exercise plan. She will RTC in 6 weeks for follow-up. - Patient education.  Discussed with patient starting Metformin XR in the future, since she experienced negative side effects with Metformin from her last appointment.    Mercer Pod, PA-C  [facility] 04/22/2016 9:02 AM

## 2016-04-22 NOTE — Patient Instructions (Addendum)
Diabetes Mellitus and Food It is important for you to manage your blood sugar (glucose) level. Your blood glucose level can be greatly affected by what you eat. Eating healthier foods in the appropriate amounts throughout the day at about the same time each day will help you control your blood glucose level. It can also help slow or prevent worsening of your diabetes mellitus. Healthy eating may even help you improve the level of your blood pressure and reach or maintain a healthy weight.  General recommendations for healthful eating and cooking habits include:  Eating meals and snacks regularly. Avoid going long periods of time without eating to lose weight.  Eating a diet that consists mainly of plant-based foods, such as fruits, vegetables, nuts, legumes, and whole grains.  Using low-heat cooking methods, such as baking, instead of high-heat cooking methods, such as deep frying. Work with your dietitian to make sure you understand how to use the Nutrition Facts information on food labels. HOW CAN FOOD AFFECT ME? Carbohydrates Carbohydrates affect your blood glucose level more than any other type of food. Your dietitian will help you determine how many carbohydrates to eat at each meal and teach you how to count carbohydrates. Counting carbohydrates is important to keep your blood glucose at a healthy level, especially if you are using insulin or taking certain medicines for diabetes mellitus. Alcohol Alcohol can cause sudden decreases in blood glucose (hypoglycemia), especially if you use insulin or take certain medicines for diabetes mellitus. Hypoglycemia can be a life-threatening condition. Symptoms of hypoglycemia (sleepiness, dizziness, and disorientation) are similar to symptoms of having too much alcohol.  If your health care provider has given you approval to drink alcohol, do so in moderation and use the following guidelines:  Women should not have more than one drink per day, and  men should not have more than two drinks per day. One drink is equal to:  12 oz of beer.  5 oz of wine.  1 oz of hard liquor.  Do not drink on an empty stomach.  Keep yourself hydrated. Have water, diet soda, or unsweetened iced tea.  Regular soda, juice, and other mixers might contain a lot of carbohydrates and should be counted. WHAT FOODS ARE NOT RECOMMENDED? As you make food choices, it is important to remember that all foods are not the same. Some foods have fewer nutrients per serving than other foods, even though they might have the same number of calories or carbohydrates. It is difficult to get your body what it needs when you eat foods with fewer nutrients. Examples of foods that you should avoid that are high in calories and carbohydrates but low in nutrients include:  Trans fats (most processed foods list trans fats on the Nutrition Facts label).  Regular soda.  Juice.  Candy.  Sweets, such as cake, pie, doughnuts, and cookies.  Fried foods. WHAT FOODS CAN I EAT? Eat nutrient-rich foods, which will nourish your body and keep you healthy. The food you should eat also will depend on several factors, including:  The calories you need.  The medicines you take.  Your weight.  Your blood glucose level.  Your blood pressure level.  Your cholesterol level. You should eat a variety of foods, including:  Protein.  Lean cuts of meat.  Proteins low in saturated fats, such as fish, egg whites, and beans. Avoid processed meats.  Fruits and vegetables.  Fruits and vegetables that may help control blood glucose levels, such as apples, mangoes,  and yams.  Dairy products.  Choose fat-free or low-fat dairy products, such as milk, yogurt, and cheese.  Grains, bread, pasta, and rice.  Choose whole grain products, such as multigrain bread, whole oats, and brown rice. These foods may help control blood pressure.  Fats.  Foods containing healthful fats, such as  nuts, avocado, olive oil, canola oil, and fish. DOES EVERYONE WITH DIABETES MELLITUS HAVE THE SAME MEAL PLAN? Because every person with diabetes mellitus is different, there is not one meal plan that works for everyone. It is very important that you meet with a dietitian who will help you create a meal plan that is just right for you.   This information is not intended to replace advice given to you by your health care provider. Make sure you discuss any questions you have with your health care provider.   Document Released: 05/26/2005 Document Revised: 09/19/2014 Document Reviewed: 07/26/2013 Elsevier Interactive Patient Education 2016 [employer].     IF you received an x-ray today, you will receive an invoice from [facility]. Please contact [facility] at [phone] with questions or concerns regarding your invoice.   IF you received labwork today, you will receive an invoice from [employer]. Please contact [facility] at [phone] with questions or concerns regarding your invoice.   Our billing staff will not be able to assist you with questions regarding bills from these companies.  You will be contacted with the lab results as soon as they are available. The fastest way to get your results is to activate your My Chart account. Instructions are located on the last page of this paperwork. If you have not heard from [place] regarding the results in 2 weeks, please contact this office.     HOME CARE INSTRUCTIONS- Sleep hygiene    Take medicines only as directed by your health care provider.  Keep regular sleeping and waking hours. Avoid naps.  Keep a sleep diary to help you and your health care provider figure out what could be causing your fatigue. Include:   When you sleep.  When you wake up during the night.  How well you sleep.   How rested you feel the next day.  Any side effects of medicines you are taking.  What you eat  and drink.   Make your bedroom a comfortable place where it is easy to fall asleep:  Put up shades or special blackout curtains to block light from outside.  Use a white noise machine to block noise.  Keep the temperature cool.   Exercise regularly as directed by your health care provider. Avoid exercising right before bedtime.  Use relaxation techniques to manage stress. Ask your health care provider to suggest some techniques that may work well for you. These may include:  Breathing exercises.  Routines to release muscle tension.  Visualizing peaceful scenes.  Cut back on alcohol, caffeinated beverages, and cigarettes, especially close to bedtime. These can disrupt your sleep.  Do not overeat or eat spicy foods right before bedtime. This can lead to digestive discomfort that can make it hard for you to sleep.  Limit screen use before bedtime. This includes:  Watching TV.  Using your smartphone, tablet, and computer.  Stick to a routine. This can help you fall asleep faster. Try to do a quiet activity, brush your teeth, and go to bed at the same time each night.  Get out of bed if you are still awake after 15 minutes of trying to  sleep. Keep the lights down, but try reading or doing a quiet activity. When you feel sleepy, go back to bed.  Make sure that you drive carefully. Avoid driving if you feel very sleepy.  Keep all follow-up appointments as directed by your health care provider. This is important.   Fatigue Fatigue is feeling tired all of the time, a lack of energy, or a lack of motivation. Occasional or mild fatigue is often a normal response to activity or life in general. However, long-lasting (chronic) or extreme fatigue may indicate an underlying medical condition. HOME CARE INSTRUCTIONS  Watch your fatigue for any changes. The following actions may help to lessen any discomfort you are feeling:  Talk to your health care provider about how much sleep you  need each night. Try to get the required amount every night.  Take medicines only as directed by your health care provider.  Eat a healthy and nutritious diet. Ask your health care provider if you need help changing your diet.  Drink enough fluid to keep your urine clear or pale yellow.  Practice ways of relaxing, such as yoga, meditation, massage therapy, or acupuncture.  Exercise regularly.   Change situations that cause you stress. Try to keep your work and personal routine reasonable.  Do not abuse illegal drugs.  Limit alcohol intake to no more than 1 drink per day for nonpregnant women and 2 drinks per day for men. One drink equals 12 ounces of beer, 5 ounces of wine, or 1 ounces of hard liquor.  Take a multivitamin, if directed by your health care provider. SEEK MEDICAL CARE IF:   Your fatigue does not get better.  You have a fever.   You have unintentional weight loss or gain.  You have headaches.   You have difficulty:   Falling asleep.  Sleeping throughout the night.  You feel angry, guilty, anxious, or sad.   You are unable to have a bowel movement (constipation).   You skin is dry.   Your legs or another part of your body is swollen.  SEEK IMMEDIATE MEDICAL CARE IF:   You feel confused.   Your vision is blurry.  You feel faint or pass out.   You have a severe headache.   You have severe abdominal, pelvic, or back pain.   You have chest pain, shortness of breath, or an irregular or fast heartbeat.   You are unable to urinate or you urinate less than normal.   You develop abnormal bleeding, such as bleeding from the rectum, vagina, nose, lungs, or nipples.  You vomit blood.   You have thoughts about harming yourself or committing suicide.   You are worried that you might harm someone else.    This information is not intended to replace advice given to you by your health care provider. Make sure you discuss any questions  you have with your health care provider.   Document Released: 06/26/2007 Document Revised: 09/19/2014 Document Reviewed: 12/31/2013 Elsevier Interactive Patient Education [employer] Elsevier Inc.

## 2016-04-23 ENCOUNTER — Encounter: Payer: Self-pay | Admitting: Physician Assistant

## 2016-04-26 ENCOUNTER — Telehealth: Payer: Self-pay | Admitting: Emergency Medicine

## 2016-04-26 NOTE — Telephone Encounter (Signed)
-----   Message from Tilda BurrowElizabeth W McVey, PA-C sent at 04/23/2016  8:06 AM EDT ----- Please call Anna Johns about her lab results.  Her thyroid labs are normal.  Her blood sugar is elevated, however this is expected from what we saw with her A1c results. Continue diet and exercise program.  Otherwise, her blood work is normal.  Thank you!

## 2016-06-24 ENCOUNTER — Ambulatory Visit (INDEPENDENT_AMBULATORY_CARE_PROVIDER_SITE_OTHER): Payer: BC Managed Care – PPO | Admitting: Physician Assistant

## 2016-06-24 VITALS — BP 136/82 | HR 90 | Temp 98.1°F | Resp 18 | Ht 66.5 in | Wt 223.0 lb

## 2016-06-24 DIAGNOSIS — M25512 Pain in left shoulder: Secondary | ICD-10-CM | POA: Diagnosis not present

## 2016-06-24 MED ORDER — MELOXICAM 15 MG PO TABS: 15.0000 mg | ORAL_TABLET | Freq: Every day | ORAL | 1 refills | Status: DC

## 2016-06-24 MED ORDER — CYCLOBENZAPRINE HCL 5 MG PO TABS: 5.0000 mg | ORAL_TABLET | Freq: Three times a day (TID) | ORAL | 1 refills | Status: DC | PRN

## 2016-06-24 NOTE — Patient Instructions (Addendum)
IF you received an x-ray today, you will receive an invoice from [facility]. Please contact [facility] at [phone] with questions or concerns regarding your invoice.   IF you received labwork today, you will receive an invoice from [employer]. Please contact [facility] at [phone] with questions or concerns regarding your invoice.   Our billing staff will not be able to assist you with questions regarding bills from these companies.  You will be contacted with the lab results as soon as they are available. The fastest way to get your results is to activate your My Chart account. Instructions are located on the last page of this paperwork. If you have not heard from [place] regarding the results in 2 weeks, please contact this office.    We recommend that you schedule a mammogram for breast cancer screening. Typically, you do not need a referral to do this. Please contact a local imaging center to schedule your mammogram.  [facility] - [phone]  *ask for the Radiology Department [facility]) - [phone] or [phone]  [facility] - [phone] [facility] - [phone] [facility] - [phone]  *ask for the Radiology Department [facility] - [phone]  *ask for the Radiology Department [facility] - [phone]  *ask for the Mammography Department [facility] - [phone]  Please ice the shoulder/neck three times per day for 15 minutes.  You can perform 3 pictures per day.  Repeat a movement 3 times.  Hold a position for 10 seconds.  Ice directly after.  Meaning you will do 9 exercises per day--3 times per day.  Three ices.   Return to the clinic if your pain does not improve in 7-10 days. Do not take the mobic with naproxen or ibuprofen.  Or you can use tylenol.   Please be mindful of sedation.   Shoulder  Range of Motion Exercises Shoulder range of motion (ROM) exercises are designed to keep the shoulder moving freely. They are often recommended for people who have shoulder pain. MOVEMENT EXERCISE When you are able, do this exercise 5-6 days per week, or as told by your health care provider. Work toward doing 2 sets of 10 swings. Pendulum Exercise How To Do This Exercise Lying Down 1. Lie face-down on a bed with your abdomen close to the side of the bed. 2. Let your arm hang over the side of the bed. 3. Relax your shoulder, arm, and hand. 4. Slowly and gently swing your arm forward and back. Do not use your neck muscles to swing your arm. They should be relaxed. If you are struggling to swing your arm, have someone gently swing it for you. When you do this exercise for the first time, swing your arm at a 15 degree angle for 15 seconds, or swing your arm 10 times. As pain lessens over time, increase the angle of the swing to 30-45 degrees. 5. Repeat steps 1-4 with the other arm. How To Do This Exercise While Standing 1. Stand next to a sturdy chair or table and hold on to it with your hand.  Bend forward at the waist.  Bend your knees slightly.  Relax your other arm and let it hang limp.  Relax the shoulder blade of the arm that is hanging and let it drop.  While keeping your shoulder relaxed, use body motion to swing your arm in small  circles. The first time you do this exercise, swing your arm for about 30 seconds or 10 times. When you do it next time, swing your arm for a little longer.  Stand up tall and relax.  Repeat steps 1-7, this time changing the direction of the circles. 2. Repeat steps 1-8 with the other arm. STRETCHING EXERCISES Do these exercises 3-4 times per day on 5-6 days per week or as told by your health care provider. Work toward holding the stretch for 20 seconds. Stretching Exercise 1 1. Lift your arm straight out in front of you. 2. Bend your arm 90 degrees at  the elbow (right angle) so your forearm goes across your body and looks like the letter "L." 3. Use your other arm to gently pull the elbow forward and across your body. 4. Repeat steps 1-3 with the other arm. Stretching Exercise 2 You will need a towel or rope for this exercise. 1. Bend one arm behind your back with the palm facing outward. 2. Hold a towel with your other hand. 3. Reach the arm that holds the towel above your head, and bend that arm at the elbow. Your wrist should be behind your neck. 4. Use your free hand to grab the free end of the towel. 5. With the higher hand, gently pull the towel up behind you. 6. With the lower hand, pull the towel down behind you. 7. Repeat steps 1-6 with the other arm. STRENGTHENING EXERCISES Do each of these exercises at four different times of day (sessions) every day or as told by your health care provider. To begin with, repeat each exercise 5 times (repetitions). Work toward doing 3 sets of 12 repetitions or as told by your health care provider. Strengthening Exercise 1 You will need a light weight for this activity. As you grow stronger, you may use a heavier weight. 1. Standing with a weight in your hand, lift your arm straight out to the side until it is at the same height as your shoulder. 2. Bend your arm at 90 degrees so that your fingers are pointing to the ceiling. 3. Slowly raise your hand until your arm is straight up in the air. 4. Repeat steps 1-3 with the other arm. Strengthening Exercise 2 You will need a light weight for this activity. As you grow stronger, you may use a heavier weight. 1. Standing with a weight in your hand, gradually move your straight arm in an arc, starting at your side, then out in front of you, then straight up over your head. 2. Gradually move your other arm in an arc, starting at your side, then out in front of you, then straight up over your head. 3. Repeat steps 1-2 with the other arm. Strengthening  Exercise 3 You will need an elastic band for this activity. As you grow stronger, gradually increase the size of the bands or increase the number of bands that you use at one time. 1. While standing, hold an elastic band in one hand and raise that arm up in the air. 2. With your other hand, pull down the band until that hand is by your side. 3. Repeat steps 1-2 with the other arm.   This information is not intended to replace advice given to you by your health care provider. Make sure you discuss any questions you have with your health care provider.   Document Released: 05/28/2003 Document Revised: 01/13/2015 Document Reviewed: 08/25/2014 Elsevier Interactive Patient Education [employer] Elsevier Inc.

## 2016-06-24 NOTE — Progress Notes (Signed)
[facility] [address] [place] [address] [phone]  By signing my name below, I, [name], attest that this documentation has been prepared under the direction and in the presence of [place], PA-C. Electronically Signed: Verlee Monte, Medical Scribe. 06/24/16. 2:52 PM.  Date:  06/24/2016   Name:  Anna Johns   DOB:  1955-11-28   MRN:  [id number]  PCP:  No primary care provider on file.   Chief Complaint  Patient presents with   Shoulder Pain    Left     History of Present Illness:  Anna Johns is a 60 y.o. female patient who presents to [facility] complaining of left shoulder pain onset 1 week ago, but it worsened last night. Pain worsens when she raises her arm upwards. Pt reports radiated pain to her wrist, shoulder swelling, and neck stiffness but isn't sure if it's related to her left shoulder. Pt is left handed and she normally sleeps on her left side, but can't since her shoulder pain began. Pt has been taking 2-3 Aleve every 4-5 hours for little relief of her symptoms. Pt was supposed to go to her orthopedic doctors that follows her for her knees last week, but she had to cancel due to her brother, who has lupus, being in the hospital a week ago- he woke up and couldn't walk due to suspected infection. He's the only one with lupus in her family. Pt was having shoulder pain before she found out about her brothers hospitalization. Pt teacher. 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 Pt denies numbness in her arm, tingling in her arm, injury to her shoulder, doing strenuous work/lfting, diaphoresis, SOB, and palpitations.   Patient Active Problem List   Diagnosis Date Noted   Uncontrolled type 2 diabetes mellitus with hyperglycemia, without long-term current use of insulin ([place]) 09/15/2015    Past Medical History:  Diagnosis Date   Allergy    Anemia    Glucose intolerance (impaired glucose tolerance) 12/25/2012   HgbA1c  of 6.1.   Urinary incontinence     Past Surgical History:  Procedure Laterality Date   ABDOMINAL HYSTERECTOMY     fibroids; ovaries intact.   CHOLECYSTECTOMY     CHOLECYSTECTOMY     KNEE SURGERY      Social History  Substance Use Topics   Smoking status: Never Smoker   Smokeless tobacco: Never Used   Alcohol use No    Family History  Problem Relation Age of Onset   COPD Mother    Heart disease Mother     CHF   Multiple myeloma Father    Lupus Brother     Allergies  Allergen Reactions   Fish Allergy     Muscles and Bluefish    Medication list has been reviewed and updated.  Current Outpatient Prescriptions on File Prior to Visit  Medication Sig Dispense Refill   oxybutynin (DITROPAN-XL) 10 MG 24 hr tablet Take 1 tablet (10 mg total) by mouth at bedtime. 90 tablet 3   Blood Pressure Monitoring (ADULT BLOOD PRESSURE CUFF LG) KIT Use as instructed (Patient not taking: Reported on 06/24/2016) 1 each 0   glucose monitoring kit (FREESTYLE) monitoring kit Use up to four times a day to monitor cbgs due to hyperglycemia and medication change. (Patient not taking: Reported on 06/24/2016) 1 each 11   PENNSAID 2 % SOLN apply 2 pumps twice daily  3   No current facility-administered medications on file  prior to visit.     Review of Systems  Constitutional: Negative for diaphoresis.  Respiratory: Negative for shortness of breath.   Cardiovascular: Negative for palpitations.  Musculoskeletal: Positive for joint pain (shoulder).       Pos neck stiffness Pos shoulder arthralgias and edema  Neurological: Negative for tingling.    Physical Examination: BP 136/82 (BP Location: Right Arm, Patient Position: Sitting, Cuff Size: Small)    Pulse 90    Temp 98.1 F (36.7 C) (Oral)    Resp 18    Ht 5' 6.5" (1.689 m)    Wt 223 lb (101.2 kg)    SpO2 96%    BMI 35.45 kg/m  Ideal Body Weight: '@FLOWAMB'$ ([id number])@  Physical Exam  Constitutional: She is oriented to  person, place, and time. She appears well-developed and well-nourished. No distress.  HENT:  Head: Normocephalic and atraumatic.  Right Ear: External ear normal.  Left Ear: External ear normal.  Eyes: Conjunctivae and EOM are normal. Pupils are equal, round, and reactive to light.  Neck: No thyromegaly present.  Cardiovascular: Normal rate.   Pulmonary/Chest: Effort normal. No respiratory distress.  Musculoskeletal:       Left shoulder: She exhibits decreased range of motion.  No spinous tenderness Tenderness at the paraspinal musculature Pain incited with cervical deviation to the left side as well as the right side ROM about 45 degrees from center from both shoulders Dec ROM with external rotation Positive Neers and Hawkins Tenderness along bicep tendon  Lymphadenopathy:    She has no cervical adenopathy.  Neurological: She is alert and oriented to person, place, and time.  Skin: She is not diaphoretic.  Psychiatric: She has a normal mood and affect. Her behavior is normal.    Assessment and Plan: [name] is a 60 y.o. female who is here today for left shoulder pain. Appears to be more musculoskeletal at this time. This can be secondary to stressors. Advised anti-inflammatory use. Precautions advised. Also advised to use a muscle relaxant at this time she was given stretches to perform 3 times a day. Followed by ice for 15 minutes. Patient will return in 7-10 days of her symptoms do not improve. Patient was warned against alarming symptoms that would warrant an immediate return (shortness of breath, chest pain, palpitations, dizziness, coughing). Patient voiced understanding. Acute pain of left shoulder  Ivar Drape, PA-C [facility] [place] 10/18/20171:59 PM  I personally performed the services described in this documentation, which was scribed in my presence. The recorded information has been reviewed and is accurate.

## 2016-08-27 ENCOUNTER — Other Ambulatory Visit: Payer: Self-pay | Admitting: Family Medicine

## 2016-09-01 ENCOUNTER — Other Ambulatory Visit: Payer: Self-pay | Admitting: Family Medicine

## 2016-10-22 ENCOUNTER — Other Ambulatory Visit: Payer: Self-pay | Admitting: Physician Assistant

## 2016-10-24 ENCOUNTER — Other Ambulatory Visit: Payer: Self-pay | Admitting: Physician Assistant

## 2016-11-19 ENCOUNTER — Ambulatory Visit (INDEPENDENT_AMBULATORY_CARE_PROVIDER_SITE_OTHER): Payer: BC Managed Care – PPO | Admitting: Physician Assistant

## 2016-11-19 VITALS — BP 150/82 | HR 90 | Temp 98.0°F | Resp 16 | Ht 67.0 in | Wt 212.0 lb

## 2016-11-19 DIAGNOSIS — N3941 Urge incontinence: Secondary | ICD-10-CM

## 2016-11-19 DIAGNOSIS — R7309 Other abnormal glucose: Secondary | ICD-10-CM

## 2016-11-19 MED ORDER — OXYBUTYNIN CHLORIDE ER 10 MG PO TB24: 10.0000 mg | ORAL_TABLET | Freq: Every day | ORAL | 3 refills | Status: DC

## 2016-11-19 NOTE — Progress Notes (Signed)
[facility] [address] [place] [address] [phone]  Date:  11/19/2016   Name:  Anna Johns   DOB:  1956-08-21   MRN:  [id number]  PCP:  Ricke Hey, MD    History of Present Illness:  Anna Johns is a 61 y.o. female patient who presents to [facility] for cc of medication refill. Patient reports that the oxybutynin has improved her bladder symptoms well.  She feels this is the best dosage.  She reports no SE.  Takes this daily except on weekends.  She can note that the urgency sxs resurface when she is not taking the medications.   We discussed her elevated blood sugar.  Patient reports that she stopped taking the medication when her lifestyle changes improved blood sugar and felt she did not need it any more.  She continues to work on weight loss with diet and exercise.  Last a1c was 7.2.  She denies poly dipsia, uria, diarrhea, or nausea.  She does have family stressors with both of her sons looking for work.  Younger son had a traumatic experience and has not recovered at this time which worries her.  Other son is looking for work and is stressed which worries her as well.  She does not want counseling at this time.  Feels she has great support with family and friends at this time.      Patient Active Problem List   Diagnosis Date Noted  . Uncontrolled type 2 diabetes mellitus with hyperglycemia, without long-term current use of insulin ([place]) 09/15/2015    Past Medical History:  Diagnosis Date  . Allergy   . Anemia   . Glucose intolerance (impaired glucose tolerance) 12/25/2012   HgbA1c of 6.1.  . Urinary incontinence     Past Surgical History:  Procedure Laterality Date  . ABDOMINAL HYSTERECTOMY     fibroids; ovaries intact.  . CHOLECYSTECTOMY    . CHOLECYSTECTOMY    . KNEE SURGERY      Social History  Substance Use Topics  . Smoking status: Never Smoker  . Smokeless tobacco: Never Used  . Alcohol use No    Family History   Problem Relation Age of Onset  . COPD Mother   . Heart disease Mother     CHF  . Multiple myeloma Father   . Lupus Brother     Allergies  Allergen Reactions  . Fish Allergy     Muscles and Bluefish    Medication list has been reviewed and updated.  Current Outpatient Prescriptions on File Prior to Visit  Medication Sig Dispense Refill  . cyclobenzaprine (FLEXERIL) 5 MG tablet Take 1-2 tablets (5-10 mg total) by mouth 3 (three) times daily as needed for muscle spasms. 30 tablet 1  . meloxicam (MOBIC) 15 MG tablet Take 1 tablet (15 mg total) by mouth daily. 30 tablet 1  . oxybutynin (DITROPAN-XL) 10 MG 24 hr tablet TAKE 1 TABLET BY MOUTH AT BEDTIME 30 tablet 0  . Blood Pressure Monitoring (ADULT BLOOD PRESSURE CUFF LG) KIT Use as instructed (Patient not taking: Reported on 06/24/2016) 1 each 0  . glucose monitoring kit (FREESTYLE) monitoring kit Use up to four times a day to monitor cbgs due to hyperglycemia and medication change. (Patient not taking: Reported on 06/24/2016) 1 each 11  . PENNSAID 2 % SOLN apply 2 pumps twice daily  3   No current facility-administered medications on file prior to visit.     ROS  ROS otherwise unremarkable unless listed above.   Physical Examination: BP (!) 146/74   Pulse 90   Temp 98 F (36.7 C)   Resp 16   Ht '5\' 7"'$  (1.702 m)   Wt 212 lb (96.2 kg)   SpO2 97%   BMI 33.20 kg/m  Ideal Body Weight: Weight in (lb) to have BMI = 25: 159.3  Physical Exam  Constitutional: She is oriented to person, place, and time. She appears well-developed and well-nourished. No distress.  HENT:  Head: Normocephalic and atraumatic.  Right Ear: Tympanic membrane, external ear and ear canal normal.  Left Ear: Tympanic membrane, external ear and ear canal normal.  Nose: Mucosal edema and rhinorrhea present. Right sinus exhibits no maxillary sinus tenderness and no frontal sinus tenderness. Left sinus exhibits no maxillary sinus tenderness and no frontal sinus  tenderness.  Mouth/Throat: No uvula swelling. No oropharyngeal exudate, posterior oropharyngeal edema or posterior oropharyngeal erythema.  Eyes: Conjunctivae and EOM are normal. Pupils are equal, round, and reactive to light.  Cardiovascular: Normal rate and regular rhythm.  Exam reveals no gallop, no distant heart sounds and no friction rub.   No murmur heard. Pulmonary/Chest: Effort normal. No respiratory distress. She has no decreased breath sounds. She has no wheezes. She has no rhonchi.  Lymphadenopathy:       Head (right side): No submandibular, no tonsillar, no preauricular and no posterior auricular adenopathy present.       Head (left side): No submandibular, no tonsillar, no preauricular and no posterior auricular adenopathy present.  Neurological: She is alert and oriented to person, place, and time.  Skin: She is not diaphoretic.  Psychiatric: She has a normal mood and affect. Her behavior is normal.    Wt Readings from Last 3 Encounters:  11/19/16 212 lb (96.2 kg)  06/24/16 223 lb (101.2 kg)  04/22/16 222 lb 9.6 oz (101 kg)    Assessment and Plan: Anna Johns is a 61 y.o. female who is here today for medication refill of the oxybutynin. Bladder sxs well controlled. With reluctance, she is willing to get her a1c rechecked. Urge urinary incontinence - Plan: oxybutynin (DITROPAN-XL) 10 MG 24 hr tablet  Elevated glucose - Plan: Hemoglobin A1c  Ivar Drape, PA-C [facility] 3/11/20187:45 PM

## 2016-11-19 NOTE — Patient Instructions (Signed)
     IF you received an x-ray today, you will receive an invoice from [facility]. Please contact [facility] at [phone] with questions or concerns regarding your invoice.   IF you received labwork today, you will receive an invoice from [facility]. Please contact [facility] at [phone] with questions or concerns regarding your invoice.   Our billing staff will not be able to assist you with questions regarding bills from these companies.  You will be contacted with the lab results as soon as they are available. The fastest way to get your results is to activate your My Chart account. Instructions are located on the last page of this paperwork. If you have not heard from us regarding the results in 2 weeks, please contact this office.     

## 2016-11-20 LAB — HEMOGLOBIN A1C
Est. average glucose Bld gHb Est-mCnc: 151 mg/dL
Hgb A1c MFr Bld: 6.9 % — ABNORMAL HIGH (ref 4.8–5.6)

## 2017-02-20 ENCOUNTER — Telehealth: Payer: Self-pay | Admitting: Physician Assistant

## 2017-02-20 NOTE — Telephone Encounter (Signed)
Please advise that she should return to have her a1c rechecked.  This is a three month mark, and we can make sure this is going down.  It was 6.9 and had improved.  So please return in 3-6 months for recheck.  My preference is 3 months.

## 2017-02-21 NOTE — Telephone Encounter (Signed)
Called and spoke with the patient advised to RTC in 3 months to recheck her DM..Marland Kitchen

## 2017-02-24 ENCOUNTER — Encounter (HOSPITAL_BASED_OUTPATIENT_CLINIC_OR_DEPARTMENT_OTHER): Payer: Self-pay | Admitting: *Deleted

## 2017-02-27 NOTE — H&P (Signed)
Anna Johns comes in for follow up.  Recurrent right knee pain.  Known osteoarthritis documented previously.  Injection in April did well until recently.  Although she has arthritis on the left that is tolerable.  It is the right that is bothering her.   Other more recent issue has been increasing pain posterior aspect of her right heel going up into her Achilles.  Getting worse rather than better.  Alteration of activity and stretching without improvement.  Altering her gait and putting more stress on her knee making that worse.  No specific trauma.   History and general exam are reviewed.   EXAMINATION: Well developed, well nourished female and in no acute distress.  Alert and oriented x 3.  Lungs clear to auscultation bilaterally.  Heart sounds normal.  Specifically, negative straight leg raise on both sides.  Negative log roll of both hips.  She still has relatively well preserved knee motion with full extension and better than 100 degrees of flexion.  Going into varus right greater than left.  Tibiofemoral and patellofemoral crepitus.  Distally on the right she has an obvious large attachment spur sticking out posteriorly, right heel.  Haglund deformity as well.  She can dorsiflex almost 10 degrees with the knee extended so she is not too tight, but there is pain up into her Achilles.  Good ankle motion and stability.  Neurovascularly intact distally.    X-RAYS: X-rays obtained, four views standing, shows progressive arthritis, getting moderately narrow medially, especially on the right.  Tricompartmental changes.  Three view x-rays of her ankle and heel shows a plantar fascia spur, which is asymptomatic.  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  No evidence of stress fracture.  DISPOSITION:  1. Osteoarthritis of both knees, primary, generalized.  Right greater than left symptomatically.  Also worse on x-ray.  Left not too intolerable.  We are going to do a Cortisone  injection in the right knee.  Follow up as needed.  We can inject the left knee in the future if symptoms warrant this.   2. In regards to her heel, I have explained what is going on.  The only thing that is going to resolve this is operative intervention with spur excision, Haglund deformity excision, debridement and primary repair Achilles tendon, SpeedBridge technique.  Procedure, risks, benefits and complications reviewed.  She has put up with this for a long time and would like to proceed.  Nothing conservative is going to help.  What to expect intra and post-op reviewed.  More than 25 minutes spent face-to-face.  I will see her prior to operative intervention.

## 2017-03-02 ENCOUNTER — Ambulatory Visit (HOSPITAL_BASED_OUTPATIENT_CLINIC_OR_DEPARTMENT_OTHER)
Admission: RE | Admit: 2017-03-02 | Discharge: 2017-03-02 | Disposition: A | Discharge status: Home / Self Care | Payer: BC Managed Care – PPO | Source: Ambulatory Visit | Attending: Orthopedic Surgery | Admitting: Orthopedic Surgery

## 2017-03-02 ENCOUNTER — Encounter (HOSPITAL_BASED_OUTPATIENT_CLINIC_OR_DEPARTMENT_OTHER)
Admission: RE | Disposition: A | Discharge status: Home / Self Care | Payer: Self-pay | Source: Ambulatory Visit | Attending: Orthopedic Surgery

## 2017-03-02 ENCOUNTER — Encounter (HOSPITAL_BASED_OUTPATIENT_CLINIC_OR_DEPARTMENT_OTHER): Payer: Self-pay

## 2017-03-02 ENCOUNTER — Ambulatory Visit (HOSPITAL_BASED_OUTPATIENT_CLINIC_OR_DEPARTMENT_OTHER): Payer: BC Managed Care – PPO | Admitting: Anesthesiology

## 2017-03-02 DIAGNOSIS — M170 Bilateral primary osteoarthritis of knee: Secondary | ICD-10-CM | POA: Insufficient documentation

## 2017-03-02 DIAGNOSIS — M7661 Achilles tendinitis, right leg: Secondary | ICD-10-CM | POA: Insufficient documentation

## 2017-03-02 DIAGNOSIS — M9261 Juvenile osteochondrosis of tarsus, right ankle: Secondary | ICD-10-CM | POA: Insufficient documentation

## 2017-03-02 DIAGNOSIS — M7732 Calcaneal spur, left foot: Secondary | ICD-10-CM | POA: Diagnosis not present

## 2017-03-02 HISTORY — PX: BONE EXCISION: SHX6730

## 2017-03-02 HISTORY — PX: ACHILLES TENDON SURGERY: SHX542

## 2017-03-02 SURGERY — BONE EXCISION
Anesthesia: General | Site: Foot | Laterality: Right

## 2017-03-02 MED ORDER — METHOCARBAMOL 500 MG PO TABS: 500.0000 mg | ORAL_TABLET | Freq: Four times a day (QID) | ORAL | 0 refills | Status: DC

## 2017-03-02 MED ORDER — PROPOFOL 10 MG/ML IV BOLUS
INTRAVENOUS | Status: DC | PRN
Start: 2017-03-02 — End: 2017-03-02
  Administered 2017-03-02: 200 mg via INTRAVENOUS

## 2017-03-02 MED ORDER — ONDANSETRON HCL 4 MG PO TABS: 4.0000 mg | ORAL_TABLET | Freq: Three times a day (TID) | ORAL | 0 refills | Status: DC | PRN

## 2017-03-02 MED ORDER — CEFAZOLIN SODIUM-DEXTROSE 2-4 GM/100ML-% IV SOLN
2.0000 g | INTRAVENOUS | Status: AC
  Administered 2017-03-02: 2 g via INTRAVENOUS

## 2017-03-02 MED ORDER — ONDANSETRON HCL 4 MG/2ML IJ SOLN
INTRAMUSCULAR | Status: AC
  Filled 2017-03-02: qty 2

## 2017-03-02 MED ORDER — FENTANYL CITRATE (PF) 100 MCG/2ML IJ SOLN
INTRAMUSCULAR | Status: AC
  Filled 2017-03-02: qty 2

## 2017-03-02 MED ORDER — ONDANSETRON HCL 4 MG/2ML IJ SOLN
INTRAMUSCULAR | Status: DC | PRN
  Administered 2017-03-02: 4 mg via INTRAVENOUS

## 2017-03-02 MED ORDER — PROPOFOL 500 MG/50ML IV EMUL
INTRAVENOUS | Status: AC
  Filled 2017-03-02: qty 50

## 2017-03-02 MED ORDER — OXYCODONE HCL 5 MG/5ML PO SOLN: 5.0000 mg | Freq: Once | ORAL | Status: DC | PRN

## 2017-03-02 MED ORDER — CEFAZOLIN SODIUM-DEXTROSE 2-4 GM/100ML-% IV SOLN
INTRAVENOUS | Status: AC
  Filled 2017-03-02: qty 100

## 2017-03-02 MED ORDER — SUCCINYLCHOLINE CHLORIDE 200 MG/10ML IV SOSY
PREFILLED_SYRINGE | INTRAVENOUS | Status: AC
  Filled 2017-03-02: qty 10

## 2017-03-02 MED ORDER — DEXAMETHASONE SODIUM PHOSPHATE 10 MG/ML IJ SOLN
INTRAMUSCULAR | Status: AC
  Filled 2017-03-02: qty 1

## 2017-03-02 MED ORDER — CHLORHEXIDINE GLUCONATE 4 % EX LIQD: 60.0000 mL | Freq: Once | CUTANEOUS | Status: DC

## 2017-03-02 MED ORDER — DEXAMETHASONE SODIUM PHOSPHATE 4 MG/ML IJ SOLN
INTRAMUSCULAR | Status: DC | PRN
  Administered 2017-03-02: 10 mg via INTRAVENOUS

## 2017-03-02 MED ORDER — OXYCODONE-ACETAMINOPHEN 5-325 MG PO TABS: 1.0000 | ORAL_TABLET | ORAL | 0 refills | Status: DC | PRN

## 2017-03-02 MED ORDER — MIDAZOLAM HCL 2 MG/2ML IJ SOLN
INTRAMUSCULAR | Status: AC
  Filled 2017-03-02: qty 2

## 2017-03-02 MED ORDER — PROMETHAZINE HCL 25 MG/ML IJ SOLN: 6.2500 mg | INTRAMUSCULAR | Status: DC | PRN

## 2017-03-02 MED ORDER — SCOPOLAMINE 1 MG/3DAYS TD PT72: 1.0000 | MEDICATED_PATCH | Freq: Once | TRANSDERMAL | Status: DC | PRN

## 2017-03-02 MED ORDER — EPHEDRINE SULFATE 50 MG/ML IJ SOLN
INTRAMUSCULAR | Status: DC | PRN
  Administered 2017-03-02: 25 mg via INTRAVENOUS

## 2017-03-02 MED ORDER — OXYCODONE HCL 5 MG PO TABS: 5.0000 mg | ORAL_TABLET | Freq: Once | ORAL | Status: DC | PRN

## 2017-03-02 MED ORDER — FENTANYL CITRATE (PF) 100 MCG/2ML IJ SOLN
50.0000 ug | INTRAMUSCULAR | Status: DC | PRN
  Administered 2017-03-02 (×2): 100 ug via INTRAVENOUS

## 2017-03-02 MED ORDER — MEPERIDINE HCL 25 MG/ML IJ SOLN: 6.2500 mg | INTRAMUSCULAR | Status: DC | PRN

## 2017-03-02 MED ORDER — LACTATED RINGERS IV SOLN: INTRAVENOUS | Status: DC

## 2017-03-02 MED ORDER — LACTATED RINGERS IV SOLN
INTRAVENOUS | Status: DC
  Administered 2017-03-02 (×3): via INTRAVENOUS

## 2017-03-02 MED ORDER — LIDOCAINE 2% (20 MG/ML) 5 ML SYRINGE
INTRAMUSCULAR | Status: AC
  Filled 2017-03-02: qty 5

## 2017-03-02 MED ORDER — EPHEDRINE 5 MG/ML INJ
INTRAVENOUS | Status: AC
  Filled 2017-03-02: qty 10

## 2017-03-02 MED ORDER — MIDAZOLAM HCL 2 MG/2ML IJ SOLN
1.0000 mg | INTRAMUSCULAR | Status: DC | PRN
  Administered 2017-03-02 (×2): 2 mg via INTRAVENOUS

## 2017-03-02 MED ORDER — HYDROMORPHONE HCL 1 MG/ML IJ SOLN: 0.2500 mg | INTRAMUSCULAR | Status: DC | PRN

## 2017-03-02 MED ORDER — LIDOCAINE 2% (20 MG/ML) 5 ML SYRINGE
INTRAMUSCULAR | Status: DC | PRN
  Administered 2017-03-02: 60 mg via INTRAVENOUS

## 2017-03-02 MED ORDER — ROPIVACAINE HCL 5 MG/ML IJ SOLN
INTRAMUSCULAR | Status: DC | PRN
  Administered 2017-03-02: 30 mL via PERINEURAL

## 2017-03-02 SURGICAL SUPPLY — 76 items
BANDAGE ACE 4X5 VEL STRL LF (GAUZE/BANDAGES/DRESSINGS) ×4 IMPLANT
BANDAGE ACE 6X5 VEL STRL LF (GAUZE/BANDAGES/DRESSINGS) ×4 IMPLANT
BANDAGE ESMARK 6X9 LF (GAUZE/BANDAGES/DRESSINGS) ×2 IMPLANT
BLADE AVERAGE 25MMX9MM (BLADE) ×1
BLADE AVERAGE 25X9 (BLADE) ×3 IMPLANT
BLADE SURG 15 STRL LF DISP TIS (BLADE) ×4 IMPLANT
BLADE SURG 15 STRL SS (BLADE) ×8
BNDG CMPR 9X6 STRL LF SNTH (GAUZE/BANDAGES/DRESSINGS) ×2
BNDG COHESIVE 4X5 TAN STRL (GAUZE/BANDAGES/DRESSINGS) ×4 IMPLANT
BNDG ESMARK 6X9 LF (GAUZE/BANDAGES/DRESSINGS) ×4
CANISTER SUCT 1200ML W/VALVE (MISCELLANEOUS) IMPLANT
COVER BACK TABLE 60X90IN (DRAPES) ×4 IMPLANT
CUFF TOURNIQUET SINGLE 34IN LL (TOURNIQUET CUFF) IMPLANT
DRAPE EXTREMITY T 121X128X90 (DRAPE) ×4 IMPLANT
DRAPE IMP U-DRAPE 54X76 (DRAPES) ×4 IMPLANT
DRAPE OEC MINIVIEW 54X84 (DRAPES) ×2 IMPLANT
DRAPE U-SHAPE 47X51 STRL (DRAPES) ×4 IMPLANT
DRSG PAD ABDOMINAL 8X10 ST (GAUZE/BANDAGES/DRESSINGS) ×4 IMPLANT
DURAPREP 26ML APPLICATOR (WOUND CARE) ×2 IMPLANT
ELECT REM PT RETURN 9FT ADLT (ELECTROSURGICAL) ×4
ELECTRODE REM PT RTRN 9FT ADLT (ELECTROSURGICAL) ×2 IMPLANT
GAUZE SPONGE 4X4 12PLY STRL (GAUZE/BANDAGES/DRESSINGS) ×4 IMPLANT
GAUZE XEROFORM 1X8 LF (GAUZE/BANDAGES/DRESSINGS) IMPLANT
GLOVE BIO SURGEON STRL SZ 6.5 (GLOVE) ×1 IMPLANT
GLOVE BIO SURGEONS STRL SZ 6.5 (GLOVE) ×1
GLOVE BIOGEL PI IND STRL 7.0 (GLOVE) ×2 IMPLANT
GLOVE BIOGEL PI INDICATOR 7.0 (GLOVE) ×6
GLOVE ECLIPSE 7.0 STRL STRAW (GLOVE) ×4 IMPLANT
GLOVE SURG ORTHO 8.0 STRL STRW (GLOVE) ×4 IMPLANT
GOWN STRL REUS W/ TWL LRG LVL3 (GOWN DISPOSABLE) ×2 IMPLANT
GOWN STRL REUS W/ TWL XL LVL3 (GOWN DISPOSABLE) ×4 IMPLANT
GOWN STRL REUS W/TWL LRG LVL3 (GOWN DISPOSABLE) ×4
GOWN STRL REUS W/TWL XL LVL3 (GOWN DISPOSABLE) ×12 IMPLANT
IMPL SYS BIOCOMP ACH SPEED (Anchor) IMPLANT
IMPLANT SYS BIOCOMP ACH SPEED (Anchor) ×4 IMPLANT
NDL 1/2 CIR CATGUT .05X1.09 (NEEDLE) IMPLANT
NDL SUT 6 .5 CRC .975X.05 MAYO (NEEDLE) IMPLANT
NEEDLE 1/2 CIR CATGUT .05X1.09 (NEEDLE) ×4 IMPLANT
NEEDLE HYPO 22GX1.5 SAFETY (NEEDLE) IMPLANT
NEEDLE MAYO TAPER (NEEDLE)
PACK BASIN DAY SURGERY FS (CUSTOM PROCEDURE TRAY) ×4 IMPLANT
PAD CAST 4YDX4 CTTN HI CHSV (CAST SUPPLIES) ×2 IMPLANT
PADDING CAST ABS 4INX4YD NS (CAST SUPPLIES) ×2
PADDING CAST ABS COTTON 4X4 ST (CAST SUPPLIES) ×2 IMPLANT
PADDING CAST COTTON 4X4 STRL (CAST SUPPLIES) ×4
PADDING CAST COTTON 6X4 STRL (CAST SUPPLIES) ×4 IMPLANT
PENCIL BUTTON HOLSTER BLD 10FT (ELECTRODE) ×4 IMPLANT
SLEEVE SCD COMPRESS KNEE MED (MISCELLANEOUS) ×2 IMPLANT
SPLINT FAST PLASTER 5X30 (CAST SUPPLIES) ×10
SPLINT FIBERGLASS 4X30 (CAST SUPPLIES) IMPLANT
SPLINT PLASTER CAST FAST 5X30 (CAST SUPPLIES) ×10 IMPLANT
SPONGE LAP 4X18 X RAY DECT (DISPOSABLE) ×4 IMPLANT
STAPLER VISISTAT 35W (STAPLE) IMPLANT
STOCKINETTE 6  STRL (DRAPES) ×2
STOCKINETTE 6 STRL (DRAPES) ×2 IMPLANT
SUCTION FRAZIER HANDLE 10FR (MISCELLANEOUS) ×2
SUCTION TUBE FRAZIER 10FR DISP (MISCELLANEOUS) ×2 IMPLANT
SUT ETHILON 3 0 PS 1 (SUTURE) ×2 IMPLANT
SUT FIBERWIRE #2 38 T-5 BLUE (SUTURE)
SUT VIC AB 0 CT1 27 (SUTURE)
SUT VIC AB 0 CT1 27XBRD ANBCTR (SUTURE) IMPLANT
SUT VIC AB 1 CT1 27 (SUTURE) ×4
SUT VIC AB 1 CT1 27XBRD ANBCTR (SUTURE) ×2 IMPLANT
SUT VIC AB 2-0 SH 27 (SUTURE) ×4
SUT VIC AB 2-0 SH 27XBRD (SUTURE) ×2 IMPLANT
SUT VIC AB 3-0 SH 27 (SUTURE)
SUT VIC AB 3-0 SH 27X BRD (SUTURE) IMPLANT
SUTURE FIBERWR #2 38 T-5 BLUE (SUTURE) IMPLANT
SYR BULB 3OZ (MISCELLANEOUS) ×4 IMPLANT
SYR CONTROL 10ML LL (SYRINGE) IMPLANT
TOWEL OR 17X24 6PK STRL BLUE (TOWEL DISPOSABLE) ×8 IMPLANT
TOWEL OR NON WOVEN STRL DISP B (DISPOSABLE) ×4 IMPLANT
TUBE CONNECTING 20'X1/4 (TUBING) ×1
TUBE CONNECTING 20X1/4 (TUBING) ×3 IMPLANT
UNDERPAD 30X30 (UNDERPADS AND DIAPERS) ×4 IMPLANT
YANKAUER SUCT BULB TIP NO VENT (SUCTIONS) ×4 IMPLANT

## 2017-03-02 NOTE — Progress Notes (Signed)
Assisted Dr. Miller with right, ultrasound guided, popliteal block. Side rails up, monitors on throughout procedure. See vital signs in flow sheet. Tolerated Procedure well. 

## 2017-03-02 NOTE — Anesthesia Procedure Notes (Signed)
Anesthesia Regional Block: Popliteal block   Pre-Anesthetic Checklist: ,, timeout performed, Correct Patient, Correct Site, Correct Laterality, Correct Procedure, Correct Position, site marked, Risks and benefits discussed,  Surgical consent,  Pre-op evaluation,  At surgeon's request and post-op pain management  Laterality: Right  Prep: Dura Prep       Needles:  Injection technique: Single-shot  Needle Type: Stimiplex     Needle Length: 9cm  Needle Gauge: 21     Additional Needles:   Procedures: ultrasound guided,,,,,,,,  Narrative:  Start time: 03/02/2017 11:03 AM End time: 03/02/2017 11:08 AM Injection made incrementally with aspirations every 5 mL.  Performed by: Personally  Anesthesiologist: Anitra LauthMILLER, Tyronn Golda RAY

## 2017-03-02 NOTE — Anesthesia Postprocedure Evaluation (Signed)
Anesthesia Post Note  Patient: Anna Johns  Procedure(s) Performed: Procedure(s) (LRB): EXCISION PARTIAL BONE TALUS/CALCANEUS RIGHT FOOT (Right) ACHILLES TENDON REPAIR, RIGHT (Right)     Patient location during evaluation: PACU Anesthesia Type: General Level of consciousness: awake and alert Pain management: pain level controlled Vital Signs Assessment: post-procedure vital signs reviewed and stable Respiratory status: spontaneous breathing, nonlabored ventilation and respiratory function stable Cardiovascular status: blood pressure returned to baseline and stable Postop Assessment: no signs of nausea or vomiting Anesthetic complications: no    Last Vitals:  Vitals:   03/02/17 1345 03/02/17 1400  BP: (!) 147/71 (!) 147/84  Pulse: 93 87  Resp: 18 19  Temp:      Last Pain:  Vitals:   03/02/17 1415  TempSrc:   PainSc: 0-No pain                 Lowella CurbWarren Ray Rolando Hessling

## 2017-03-02 NOTE — Anesthesia Preprocedure Evaluation (Signed)
Anesthesia Evaluation  Patient identified by MRN, date of birth, ID band Patient awake    Reviewed: Allergy & Precautions, NPO status , Patient's Chart, lab work & pertinent test results  Airway Mallampati: II  TM Distance: >3 FB Neck ROM: Full    Dental no notable dental hx.    Pulmonary neg pulmonary ROS,    Pulmonary exam normal breath sounds clear to auscultation       Cardiovascular negative cardio ROS Normal cardiovascular exam Rhythm:Regular Rate:Normal     Neuro/Psych negative neurological ROS  negative psych ROS   GI/Hepatic negative GI ROS, Neg liver ROS,   Endo/Other  negative endocrine ROSdiabetes  Renal/GU negative Renal ROS  negative genitourinary   Musculoskeletal negative musculoskeletal ROS (+)   Abdominal (+) + obese,   Peds negative pediatric ROS (+)  Hematology negative hematology ROS (+)   Anesthesia Other Findings   Reproductive/Obstetrics negative OB ROS                             Anesthesia Physical Anesthesia Plan  ASA: II  Anesthesia Plan: General   Post-op Pain Management: GA combined w/ Regional for post-op pain   Induction: Intravenous  PONV Risk Score and Plan: 3 and Ondansetron, Dexamethasone, Propofol and Midazolam  Airway Management Planned:   Additional Equipment:   Intra-op Plan:   Post-operative Plan:   Informed Consent: I have reviewed the patients History and Physical, chart, labs and discussed the procedure including the risks, benefits and alternatives for the proposed anesthesia with the patient or authorized representative who has indicated his/her understanding and acceptance.   Dental advisory given  Plan Discussed with: CRNA  Anesthesia Plan Comments:         Anesthesia Quick Evaluation

## 2017-03-02 NOTE — Anesthesia Procedure Notes (Signed)
Procedure Name: Intubation Date/Time: 03/02/2017 12:04 PM Performed by: Gar GibbonKEETON, Cesilia Shinn S Pre-anesthesia Checklist: Patient identified, Emergency Drugs available, Suction available and Patient being monitored Patient Re-evaluated:Patient Re-evaluated prior to inductionOxygen Delivery Method: Circle system utilized Preoxygenation: Pre-oxygenation with 100% oxygen Intubation Type: IV induction Ventilation: Mask ventilation without difficulty Laryngoscope Size: Miller and 2 Grade View: Grade II Tube type: Oral Tube size: 7.0 mm Number of attempts: 1 Airway Equipment and Method: Stylet and Oral airway Placement Confirmation: ETT inserted through vocal cords under direct vision,  positive ETCO2 and breath sounds checked- equal and bilateral Secured at: 2 cm Tube secured with: Tape Dental Injury: Teeth and Oropharynx as per pre-operative assessment

## 2017-03-02 NOTE — Discharge Instructions (Signed)
Care After Refer to this sheet in the next few weeks. These discharge instructions provide you with general information on caring for yourself after you leave the hospital. Your caregiver may also give you specific instructions. Your treatment has been planned according to the most current medical practices available, but unavoidable complications sometimes occur. If you have any problems or questions after discharge, please call your caregiver. HOME INSTRUCTIONS You may resume a normal diet and activities as directed. Walk with crutches NON WEIGHT BEARING Do NOT get cast wet.  Do NOT remove dressing until you come back to the doctor Only take over-the-counter or prescription medicines for pain, discomfort, or fever as directed by your caregiver.  Eat a well-balanced diet.  Avoid lifting or driving until you are instructed otherwise.  Make an appointment to see your caregiver for stitches (suture) or staple removal as directed.   SEEK MEDICAL CARE IF: You have swelling of your calf or leg.  You develop shortness of breath or chest pain.  You have redness, swelling, or increasing pain in the wound.  There is pus or any unusual drainage coming from the surgical site.  You notice a bad smell coming from the surgical site or dressing.  The surgical site breaks open after sutures or staples have been removed.  There is persistent bleeding from the suture or staple line.  You are getting worse or are not improving.  You have any other questions or concerns.  SEEK IMMEDIATE MEDICAL CARE IF:  You have a fever.  You develop a rash.  You have difficulty breathing.  You develop any reaction or side effects to medicines given.  Your knee motion is decreasing rather than improving.  MAKE SURE YOU:  Understand these instructions.  Will watch your condition.  Will get help right away if you are not doing well or get worse.     Post Anesthesia Home Care Instructions  Activity: Get plenty of rest  for the remainder of the day. A responsible individual must stay with you for 24 hours following the procedure.  For the next 24 hours, DO NOT: -Drive a car -Advertising copywriterperate machinery -Drink alcoholic beverages -Take any medication unless instructed by your physician -Make any legal decisions or sign important papers.  Meals: Start with liquid foods such as gelatin or soup. Progress to regular foods as tolerated. Avoid greasy, spicy, heavy foods. If nausea and/or vomiting occur, drink only clear liquids until the nausea and/or vomiting subsides. Call your physician if vomiting continues.  Special Instructions/Symptoms: Your throat may feel dry or sore from the anesthesia or the breathing tube placed in your throat during surgery. If this causes discomfort, gargle with warm salt water. The discomfort should disappear within 24 hours.  If you had a scopolamine patch placed behind your ear for the management of post- operative nausea and/or vomiting:  1. The medication in the patch is effective for 72 hours, after which it should be removed.  Wrap patch in a tissue and discard in the trash. Wash hands thoroughly with soap and water. 2. You may remove the patch earlier than 72 hours if you experience unpleasant side effects which may include dry mouth, dizziness or visual disturbances. 3. Avoid touching the patch. Wash your hands with soap and water after contact with the patch.

## 2017-03-02 NOTE — Transfer of Care (Signed)
Immediate Anesthesia Transfer of Care Note  Patient: Anna Johns  Procedure(s) Performed: Procedure(s): EXCISION PARTIAL BONE TALUS/CALCANEUS RIGHT FOOT (Right) ACHILLES TENDON REPAIR, RIGHT (Right)  Patient Location: PACU  Anesthesia Type:GA combined with regional for post-op pain  Level of Consciousness: awake and patient cooperative  Airway & Oxygen Therapy: Patient Spontanous Breathing and Patient connected to face mask oxygen  Post-op Assessment: Report given to RN and Post -op Vital signs reviewed and stable  Post vital signs: Reviewed and stable  Last Vitals:  Vitals:   03/02/17 1110 03/02/17 1115  BP:  131/70  Pulse: 81 74  Resp: 11 14  Temp:      Last Pain:  Vitals:   03/02/17 1019  TempSrc: Oral  PainSc: 5       Patients Stated Pain Goal: 2 (03/02/17 1019)  Complications: No apparent anesthesia complications

## 2017-03-03 ENCOUNTER — Encounter (HOSPITAL_BASED_OUTPATIENT_CLINIC_OR_DEPARTMENT_OTHER): Payer: Self-pay | Admitting: Orthopedic Surgery

## 2017-03-03 NOTE — Op Note (Signed)
NAME:  Festus BarrenMCKENZIE, Grettel            ACCOUNT NO.:  [id number]  MEDICAL RECORD NO.:  [id number]  LOCATION:                                 FACILITY:  PHYSICIAN:  Loreta Aveaniel F. Mearl Harewood, M.D.      DATE OF BIRTH:  DATE OF PROCEDURE:  03/02/2017 DATE OF DISCHARGE:                              OPERATIVE REPORT   PREOPERATIVE DIAGNOSES:  Right heel Haglund's deformity, attachment spur, and significant tendinopathy and partial tearing of distal Achilles tendon.  POSTOPERATIVE DIAGNOSES:  Right heel Haglund's deformity, attachment spur, and significant tendinopathy and partial tearing of distal Achilles tendon.  PROCEDURES:  Right heel exploration.  Takedown debridement and primary repair of Achilles tendon utilizing Arthrex SpeedBridge technique. Excision of os calcis spurs at the attachment as well as Haglund's deformity.  SURGEON:  Loreta Aveaniel F. Jaymarion Trombly, M.D.  ASSISTANT:  Mikey KirschnerLindsey Stanberry, PA, present throughout the entire case and necessary for timely completion of procedure.  ANESTHESIA:  General.  BLOOD LOSS:  Minimal.  SPECIMENS:  None.  CULTURES:  None.  COMPLICATIONS:  None.  DRESSINGS:  Soft, compressive well-padded short-leg splint.  TOURNIQUET TIME:  45 minutes.  DESCRIPTION OF PROCEDURE:  The patient was brought to the operating room and after adequate anesthesia had been obtained, tourniquet applied in the upper aspect of the right leg.  Turned to a prone position with appropriate padding and support.  Prepped and draped in usual sterile fashion.  Exsanguinated with elevation of Esmarch.  Tourniquet inflated to 350 mmHg.  Longitudinal incision in the lateral border of the Achilles over the os calcis.  Skin and subcutaneous tissues were divided.  Marked tendinopathy, partial tearing, calcification distal Achilles tendon.  Taken down distally, retracted proximally, debrided back to healthy tissue.  The attachment spurs, Haglund's deformity, and inflammatory tissue all  debrided and contoured smoothly confirming this well done with fluoroscopy.  I then made 4 drill holes for the SpeedBridge technique.  Two proximal anchors were placed.  Sutures were brought through the tendon and crossed over and put in the 2 distal SwiveLock anchors.  Two sutures from the proximal SwiveLock were used to tie down at the tendon at the margins.  At completion, nice firm reattachment repair.  I can get a plantigrade with the knee flexed at completion. Wound irrigated, closed with nylon.  Sterile compressive dressing applied.  Short-leg splint applied.  Tourniquet deflated and removed. Anesthesia reversed.  Brought to the recovery room.  Tolerated the surgery well.  No complications.     Loreta Aveaniel F. Elliett Guarisco, M.D.     DFM/MEDQ  D:  03/02/2017  T:  03/03/2017  Job:  [id number]

## 2017-03-03 NOTE — Addendum Note (Signed)
Addendum  created 03/03/17 0917 by Ronnette HilaPayne, Chisa Kushner D, CRNA   Charge Capture section accepted

## 2017-05-22 ENCOUNTER — Encounter: Payer: Self-pay | Admitting: Family Medicine

## 2017-05-22 ENCOUNTER — Ambulatory Visit (INDEPENDENT_AMBULATORY_CARE_PROVIDER_SITE_OTHER): Payer: BC Managed Care – PPO | Admitting: Family Medicine

## 2017-05-22 VITALS — BP 138/80 | HR 94 | Temp 98.1°F | Resp 17 | Ht 67.0 in | Wt 213.2 lb

## 2017-05-22 DIAGNOSIS — E119 Type 2 diabetes mellitus without complications: Secondary | ICD-10-CM | POA: Diagnosis not present

## 2017-05-22 DIAGNOSIS — M7502 Adhesive capsulitis of left shoulder: Secondary | ICD-10-CM | POA: Diagnosis not present

## 2017-05-22 DIAGNOSIS — M67912 Unspecified disorder of synovium and tendon, left shoulder: Secondary | ICD-10-CM

## 2017-05-22 MED ORDER — METHOCARBAMOL 500 MG PO TABS: 500.0000 mg | ORAL_TABLET | Freq: Four times a day (QID) | ORAL | 0 refills | Status: DC

## 2017-05-22 MED ORDER — IBUPROFEN 600 MG PO TABS: 600.0000 mg | ORAL_TABLET | Freq: Three times a day (TID) | ORAL | 0 refills | Status: DC | PRN

## 2017-05-22 MED ORDER — DICLOFENAC SODIUM 1 % TD GEL: 2.0000 g | Freq: Four times a day (QID) | TRANSDERMAL | 0 refills | Status: DC

## 2017-05-22 NOTE — Progress Notes (Signed)
Chief Complaint  Patient presents with  . Shoulder Pain    left shoulder, onset: couple weeks having to lift it with the right arm, acting up today, with fall wonders if she has knocked something out of place.    HPI   Pt reports that she had surgery 03/02/17 to repair an achilles tendon  She reports that she was trying to protect her right side so to break the fall she landed on her left shoulder 3 weeks ago Now she reports that she has some pain in the shoulder joint and also in the muscle and the tendon She reports that she has some difficulty with abduction  Diabetes Lab Results  Component Value Date   HGBA1C 6.9 (H) 11/19/2016   Pt reports that her sugars are in good range She is compliant with her diabetes plan She denies any change to her medication recently She denies dizziness, increase thirst or urination  Past Medical History:  Diagnosis Date  . Allergy   . Anemia   . Glucose intolerance (impaired glucose tolerance) 12/25/2012   HgbA1c of 6.1.  . Urinary incontinence     Current Outpatient Prescriptions  Medication Sig Dispense Refill  . oxybutynin (DITROPAN-XL) 10 MG 24 hr tablet Take 1 tablet (10 mg total) by mouth at bedtime. 90 tablet 3  . diclofenac sodium (VOLTAREN) 1 % GEL Apply 2 g topically 4 (four) times daily. 100 g 0  . ibuprofen (ADVIL,MOTRIN) 600 MG tablet Take 1 tablet (600 mg total) by mouth every 8 (eight) hours as needed. With food 30 tablet 0  . methocarbamol (ROBAXIN) 500 MG tablet Take 1 tablet (500 mg total) by mouth 4 (four) times daily. 30 tablet 0  . ondansetron (ZOFRAN) 4 MG tablet Take 1 tablet (4 mg total) by mouth every 8 (eight) hours as needed for nausea or vomiting. (Patient not taking: Reported on 05/22/2017) 40 tablet 0  . oxyCODONE-acetaminophen (ROXICET) 5-325 MG tablet Take 1-2 tablets by mouth every 4 (four) hours as needed. (Patient not taking: Reported on 05/22/2017) 60 tablet 0   No current facility-administered medications  for this visit.     Allergies:  Allergies  Allergen Reactions  . Fish Allergy     Muscles and Bluefish    Past Surgical History:  Procedure Laterality Date  . ABDOMINAL HYSTERECTOMY     fibroids; ovaries intact.  . ACHILLES TENDON SURGERY Right 03/02/2017   Procedure: ACHILLES TENDON REPAIR, RIGHT;  Surgeon: Loreta AveMurphy, Daniel F, MD;  Location: [facility];  Service: Orthopedics;  Laterality: Right;  . BONE EXCISION Right 03/02/2017   Procedure: EXCISION PARTIAL BONE TALUS/CALCANEUS RIGHT FOOT;  Surgeon: Loreta AveMurphy, Daniel F, MD;  Location: [facility];  Service: Orthopedics;  Laterality: Right;  . CHOLECYSTECTOMY    . CHOLECYSTECTOMY    . KNEE SURGERY      Social History   Social History  . Marital status: Divorced    Spouse name: N/A  . Number of children: N/A  . Years of education: N/A   Social History Main Topics  . Smoking status: Never Smoker  . Smokeless tobacco: Never Used  . Alcohol use No  . Drug use: No  . Sexual activity: No   Other Topics Concern  . None   Social History Narrative  . None    ROS  Objective: Vitals:   05/22/17 1732  BP: 138/80  Pulse: 94  Resp: 17  Temp: 98.1 F (36.7 C)  TempSrc: Oral  SpO2: 97%  Weight: 213 lb 3.2 oz (96.7 kg)  Height:  (1.702 m)    Physical Exam  Constitutional: She appears well-developed and well-nourished.  HENT:  Head: Normocephalic and atraumatic.  Cardiovascular: Normal rate, regular rhythm and normal heart sounds.   Pulmonary/Chest: Effort normal and breath sounds normal. No respiratory distress.  Musculoskeletal:       Right shoulder: Normal. She exhibits normal range of motion, no tenderness, no bony tenderness, no swelling, no effusion, no crepitus, no deformity, no laceration, no pain, no spasm, normal pulse and normal strength.       Left shoulder: She exhibits decreased range of motion, tenderness and pain. She exhibits no swelling, no effusion, no deformity, no  laceration, no spasm, normal pulse and normal strength.    Assessment and Plan Brunella was seen today for shoulder pain.  Diagnoses and all orders for this visit:  Adhesive capsulitis of left shoulder -     diclofenac sodium (VOLTAREN) 1 % GEL; Apply 2 g topically 4 (four) times daily. -     ibuprofen (ADVIL,MOTRIN) 600 MG tablet; Take 1 tablet (600 mg total) by mouth every 8 (eight) hours as needed. With food -     Ambulatory referral to Physical Therapy  Dysfunction of left rotator cuff -     diclofenac sodium (VOLTAREN) 1 % GEL; Apply 2 g topically 4 (four) times daily. -     ibuprofen (ADVIL,MOTRIN) 600 MG tablet; Take 1 tablet (600 mg total) by mouth every 8 (eight) hours as needed. With food -     Ambulatory referral to Physical Therapy  Type 2 diabetes mellitus without complication, without long-term current use of insulin (HCC)  Other orders -     methocarbamol (ROBAXIN) 500 MG tablet; Take 1 tablet (500 mg total) by mouth 4 (four) times daily.  gave a trial of antiinflammatory as well as a muscle relaxer Will opt for conservative mgmt Referral to PT If no improvement will try intraarticular steroid injection Due to her concurrent diabetes will not do prednisone at this time    Deronda Christian A Creta Levin

## 2017-05-22 NOTE — Patient Instructions (Addendum)
   IF you received an x-ray today, you will receive an invoice from [facility]. Please contact  Radiology at [phone] with questions or concerns regarding your invoice.   IF you received labwork today, you will receive an invoice from [facility]. Please contact [facility] at [phone] with questions or concerns regarding your invoice.   Our billing staff will not be able to assist you with questions regarding bills from these companies.  You will be contacted with the lab results as soon as they are available. The fastest way to get your results is to activate your My Chart account. Instructions are located on the last page of this paperwork. If you have not heard from us regarding the results in 2 weeks, please contact this office.    We recommend that you schedule a mammogram for breast cancer screening. Typically, you do not need a referral to do this. Please contact a local imaging center to schedule your mammogram.  [facility] - [phone]  *ask for the Radiology Department [facility]) - [phone] or [phone]  [facility] - [phone] [facility] - [phone] [facility] - [phone]  *ask for the Radiology Department [facility] - [phone]  *ask for the Radiology Department [facility] - [phone]  *ask for the Mammography Department [facility] - [phone] Adhesive Capsulitis Adhesive capsulitis is inflammation of the tendons and ligaments that surround the shoulder joint (shoulder capsule). This condition causes the shoulder to become stiff and painful to move. Adhesive capsulitis is also called frozen shoulder. What are the causes? This condition may be caused by:  An injury to the shoulder joint.  Straining the shoulder.  Not moving the shoulder for a period of time. This can happen if your arm was injured or in  a sling.  Long-standing health problems, such as: ? Diabetes. ? Thyroid problems. ? Heart disease. ? Stroke. ? Rheumatoid arthritis. ? Lung disease.  In some cases, the cause may not be known. What increases the risk? This condition is more likely to develop in:  Women.  People who are older than 60 years of age.  What are the signs or symptoms? Symptoms of this condition include:  Pain in the shoulder when moving the arm. There may also be pain when parts of the shoulder are touched. The pain is worse at night or when at rest.  Soreness or aching in the shoulder.  Inability to move the shoulder normally.  Muscle spasms.  How is this diagnosed? This condition is diagnosed with a physical exam and imaging tests, such as an X-ray or MRI. How is this treated? This condition may be treated with:  Treatment of the underlying cause or condition.  Physical therapy. This involves performing exercises to get the shoulder moving again.  Medicine. Medicine may be given to relieve pain, inflammation, or muscle spasms.  Steroid injections into the shoulder joint.  Shoulder manipulation. This is a procedure to move the shoulder into another position. It is done after you are given a medicine to make you fall asleep (general anesthetic). The joint may also be injected with salt water at high pressure to break down scarring.  Surgery. This may be done in severe cases when other treatments have failed.  Although most people recover completely from adhesive capsulitis, some may not regain the full movement of the shoulder. Follow these instructions at home:  Take over-the-counter and   prescription medicines only as told by your health care provider.  If you are being treated with physical therapy, follow instructions from your physical therapist.  Avoid exercises that put a lot of demand on your shoulder, such as throwing. These exercises can make pain worse.  If directed, apply  ice to the injured area: ? Put ice in a plastic bag. ? Place a towel between your skin and the bag. ? Leave the ice on for 20 minutes, 2-3 times per day. Contact a health care provider if:  You develop new symptoms.  Your symptoms get worse. This information is not intended to replace advice given to you by your health care provider. Make sure you discuss any questions you have with your health care provider. Document Released: 06/26/2009 Document Revised: 02/04/2016 Document Reviewed: 12/22/2014 Elsevier Interactive Patient Education  2018 [employer]  

## 2017-08-15 ENCOUNTER — Encounter: Payer: Self-pay | Admitting: Physician Assistant

## 2017-08-15 ENCOUNTER — Ambulatory Visit: Payer: BC Managed Care – PPO | Admitting: Physician Assistant

## 2017-08-15 ENCOUNTER — Other Ambulatory Visit: Payer: Self-pay

## 2017-08-15 VITALS — BP 138/88 | HR 83 | Temp 98.3°F | Resp 18 | Ht 66.85 in | Wt 218.4 lb

## 2017-08-15 DIAGNOSIS — M62838 Other muscle spasm: Secondary | ICD-10-CM | POA: Diagnosis not present

## 2017-08-15 DIAGNOSIS — M545 Low back pain, unspecified: Secondary | ICD-10-CM

## 2017-08-15 LAB — POCT URINALYSIS DIP (MANUAL ENTRY)
BILIRUBIN UA: NEGATIVE
BILIRUBIN UA: NEGATIVE mg/dL
Blood, UA: NEGATIVE
GLUCOSE UA: NEGATIVE mg/dL
Nitrite, UA: NEGATIVE
Protein Ur, POC: NEGATIVE mg/dL
SPEC GRAV UA: 1.02 (ref 1.010–1.025)
Urobilinogen, UA: 0.2 E.U./dL
pH, UA: 6 (ref 5.0–8.0)

## 2017-08-15 MED ORDER — MELOXICAM 7.5 MG PO TABS: 7.5000 mg | ORAL_TABLET | Freq: Every day | ORAL | 0 refills | Status: DC | PRN

## 2017-08-15 MED ORDER — METHOCARBAMOL 500 MG PO TABS: 500.0000 mg | ORAL_TABLET | Freq: Four times a day (QID) | ORAL | 0 refills | Status: DC

## 2017-08-15 NOTE — Progress Notes (Signed)
Anna Quarrynnette R Amer  MRN: [id number] DOB: May 15, 1956  Subjective:   Anna Johns is a 61 y.o. female who presents for evaluation of low back pain. The patient has had no prior back problems. Symptoms have been present for 3 days and are gradually improving.  Onset was related to / precipitated by pushing/lifting a handicap student at work.  Notes she has never done much heavy lifting, pushing, or pulling.  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 She has been doing a lot more lifting, pushing, and pulling than she is used to. The pain is located in the right lumbar area or left lumbar area and does not radiate. The pain is described as cramping and occurs with certain movements. She rates her pain as a 6 on a scale of 0-10. Symptoms are exacerbated by extension, standing and twisting. Symptoms are improved by NSAIDs and water.  She denies trauma, weakness in the right leg, weakness in the left leg, tingling in the right leg, tingling in the left leg, burning pain in the right leg, burning pain in the left leg, dysuria, urinary hesitancy, urinary frequency, hematuria, urinary retention, bowel incontinence, constipation, impotence and groin/perineal numbness associated with the back pain. The patient has no "red flag" history indicative of complicated back pain. She wants to have her urine checked just to be sure but is not having any urinary complaints.   Review of Systems  Constitutional: Negative for chills, fatigue and fever.    Patient Active Problem List   Diagnosis Date Noted  . Uncontrolled type 2 diabetes mellitus with hyperglycemia, without long-term current use of insulin (HCC) 09/15/2015    Current Outpatient Medications on File Prior to Visit  Medication Sig Dispense Refill  . oxybutynin (DITROPAN-XL) 10 MG 24 hr tablet Take 1 tablet (10 mg total) by mouth at bedtime. 90 tablet 3  . diclofenac sodium  (VOLTAREN) 1 % GEL Apply 2 g topically 4 (four) times daily. (Patient not taking: Reported on 08/15/2017) 100 g 0  . ibuprofen (ADVIL,MOTRIN) 600 MG tablet Take 1 tablet (600 mg total) by mouth every 8 (eight) hours as needed. With food (Patient not taking: Reported on 08/15/2017) 30 tablet 0  . methocarbamol (ROBAXIN) 500 MG tablet Take 1 tablet (500 mg total) by mouth 4 (four) times daily. (Patient not taking: Reported on 08/15/2017) 30 tablet 0   No current facility-administered medications on file prior to visit.     Allergies  Allergen Reactions  . Fish Allergy     Muscles and Bluefish     Objective:  BP 138/88 (BP Location: Left Arm, Patient Position: Sitting, Cuff Size: Large)   Pulse 83   Temp 98.3 F (36.8 C) (Oral)   Resp 18   Ht 5' 6.85" (1.698 m)   Wt 218 lb 6.4 oz (99.1 kg)   SpO2 99%   BMI 34.36 kg/m   Physical Exam  Constitutional: She is oriented to person, place, and time and well-developed, well-nourished, and in no distress.  HENT:  Head: Normocephalic and atraumatic.  Eyes: Conjunctivae are normal.  Neck: Normal range of motion.  Pulmonary/Chest: Effort normal.  Abdominal: Normal appearance. There is no tenderness. There is no CVA tenderness.  Musculoskeletal:       Thoracic back: Normal.       Lumbar back: She exhibits decreased range of motion (with extenstion and rotation ), tenderness (with palpation of spasm)  and spasm (noted in bilateral musculature, more noticable with extension and rotation to the right). She exhibits no bony tenderness, no swelling and no edema.  Neurological: She is alert and oriented to person, place, and time. She has a normal Straight Leg Raise Test. Gait normal.  Reflex Scores:      Patellar reflexes are 2+ on the right side and 2+ on the left side.      Achilles reflexes are 2+ on the right side and 2+ on the left side. Lower extremity strength 5/5 bilaterally. Sensation of bilateral lower extremity is intact.   Skin: Skin is  warm and dry.  Psychiatric: Affect normal.  Vitals reviewed.    Results for orders placed or performed in visit on 08/15/17 (from the past 24 hour(s))  POCT urinalysis dipstick     Status: Abnormal   Collection Time: 08/15/17  8:29 AM  Result Value Ref Range   Color, UA yellow yellow   Clarity, UA clear clear   Glucose, UA negative negative mg/dL   Bilirubin, UA negative negative   Ketones, POC UA negative negative mg/dL   Spec Grav, UA 1.6101.020 9.6041.010 - 1.025   Blood, UA negative negative   pH, UA 6.0 5.0 - 8.0   Protein Ur, POC negative negative mg/dL   Urobilinogen, UA 0.2 0.2 or 1.0 E.U./dL   Nitrite, UA Negative Negative   Leukocytes, UA Trace (A) Negative    Assessment and Plan :  1. Acute bilateral low back pain without sciatica - POCT urinalysis dipstick - meloxicam (MOBIC) 7.5 MG tablet; Take 1-2 tablets (7.5-15 mg total) by mouth daily as needed for pain.  Dispense: 60 tablet; Refill: 0 2. Muscle spasm History and physical exam findings consistent with muscle spasm.  No midline tenderness.  No acute findings on neuro exam.  UA with trace leukocytes but otherwise is normal.  She has no urinary symptoms.  Will treat for acute low back pain and muscle spasm at this time with anti-inflammatory muscle relaxant.  Recommended applying heating pad to the affected area 3-4 times a day for 20 minutes at a time.  Given educational material on low back stretches to start performing as tolerated.  Encouraged to avoid heavy lifting, pulling, pushing at work. - methocarbamol (ROBAXIN) 500 MG tablet; Take 1 tablet (500 mg total) by mouth 4 (four) times daily.  Dispense: 30 tablet; Refill: 0  The patient was advised to return to clinic if symptoms worsen, do not improve with above treatment, or as needed.  Benjiman CoreBrittany Tiffani Kadow PA-C  [facility]  [facility] 08/15/2017 8:27 AM

## 2017-08-15 NOTE — Patient Instructions (Addendum)
I recommend resting today. However, tomorrow I would begin walking and moving around as much as tolerated. Begin stretching in a couple of days. The worse thing you can do for low back pain is lie in bed all day or sit down all day. Use medications as needed.   Just to know, robaxin can cause side effects that may impair your thinking or reactions. Be careful if you drive or do anything that requires you to be awake and alert. void drinking alcohol, which can increase some of the side effects of Flexeril.  NSAIDs like meloxicam have common side effects of heartburn, stomach pain, indigestion, and headache. Could lead to renal insufficiency, stroke, or GI bleed if taken excess amounts outside of what is recommended on label long term.    You should avoid heavy lifting or strenuous repetitive activity to prevent recurrence of event. You should apply a heating pad to the affected area daily. Leave on for 15-20 minutes, 3-4 times a day.  When you think you can tolerate it, pease perform exercises below. Stretches are to be performed for 2 sets, holding 10-15 seconds each. Recommended to perform this rehab twice daily within pain tolerance for 2 weeks.  Return to clinic if symptoms worsen, do not improve in 5-7 days, or as needed  Thank you for letting me participate in your health and well being.    FLEXION RANGE OF MOTION AND STRETCHING EXERCISES: STRETCH - Flexion, Single Knee to Chest   Lie on a firm bed or floor with both legs extended in front of you.  Keeping one leg in contact with the floor, bring your opposite knee to your chest. Hold your leg in place by either grabbing behind your thigh or at your knee.  Pull until you feel a gentle stretch in your lower back.   Slowly release your grasp and repeat the exercise with the opposite side.  STRETCH - Flexion, Double Knee to Chest   Lie on a firm bed or floor with both legs extended in front of you.  Keeping one leg in contact  with the floor, bring your opposite knee to your chest.  Tense your stomach muscles to support your back and then lift your other knee to your chest. Hold your legs in place by either grabbing behind your thighs or at your knees.  Pull both knees toward your chest until you feel a gentle stretch in your lower back.   Tense your stomach muscles and slowly return one leg at a time to the floor.  STRETCH - Low Trunk Rotation  Lie on a firm bed or floor. Keeping your legs in front of you, bend your knees so they are both pointed toward the ceiling and your feet are flat on the floor.  Extend your arms out to the side. This will stabilize your upper body by keeping your shoulders in contact with the floor.  Gently and slowly drop both knees together to one side until you feel a gentle stretch in your lower back.   Tense your stomach muscles to support your lower back as you bring your knees back to the starting position. Repeat the exercise to the other side.   EXTENSION RANGE OF MOTION AND FLEXIBILITY EXERCISES: STRETCH - Extension, Prone on Elbows   Lie on your stomach on the floor, a bed will be too soft. Place your palms about shoulder width apart and at the height of your head.  Place your elbows under your shoulders. If  this is too painful, stack pillows under your chest.  Allow your body to relax so that your hips drop lower and make contact more completely with the floor.  Slowly return to lying flat on the floor.  RANGE OF MOTION - Extension, Prone Press Ups  Lie on your stomach on the floor, a bed will be too soft. Place your palms about shoulder width apart and at the height of your head.  Keeping your back as relaxed as possible, slowly straighten your elbows while keeping your hips on the floor. You may adjust the placement of your hands to maximize your comfort. As you gain motion, your hands will come more underneath your shoulders.  Slowly return to lying flat on the  floor.  RANGE OF MOTION- Quadruped, Neutral Spine   Assume a hands and knees position on a firm surface. Keep your hands under your shoulders and your knees under your hips. You may place padding under your knees for comfort.  Drop your head and point your tail bone toward the ground below you. This will round out your lower back like an angry cat.    Slowly lift your head and release your tail bone so that your back sags into a large arch, like an old horse.  Repeat this until you feel limber in your lower back.  Now, find your "sweet spot." This will be the most comfortable position somewhere between the two previous positions. This is your neutral spine. Once you have found this position, tense your stomach muscles to support your lower back.  STRENGTHENING EXERCISES - Low Back Strain These exercises may help you when beginning to rehabilitate your injury. These exercises should be done near your "sweet spot." This is the neutral, low-back arch, somewhere between fully rounded and fully arched, that is your least painful position. When performed in this safe range of motion, these exercises can be used for people who have either a flexion or extension based injury. These exercises may resolve your symptoms with or without further involvement from your physician, physical therapist or athletic trainer. While completing these exercises, remember:   Muscles can gain both the endurance and the strength needed for everyday activities through controlled exercises.  Complete these exercises as instructed by your physician, physical therapist or athletic trainer. Increase the resistance and repetitions only as guided.  You may experience muscle soreness or fatigue, but the pain or discomfort you are trying to eliminate should never worsen during these exercises. If this pain does worsen, stop and make certain you are following the directions exactly. If the pain is still present after adjustments,  discontinue the exercise until you can discuss the trouble with your caregiver.  STRENGTHENING - Deep Abdominals, Pelvic Tilt  Lie on a firm bed or floor. Keeping your legs in front of you, bend your knees so they are both pointed toward the ceiling and your feet are flat on the floor.  Tense your lower abdominal muscles to press your lower back into the floor. This motion will rotate your pelvis so that your tail bone is scooping upwards rather than pointing at your feet or into the floor.  STRENGTHENING - Abdominals, Crunches   Lie on a firm bed or floor. Keeping your legs in front of you, bend your knees so they are both pointed toward the ceiling and your feet are flat on the floor. Cross your arms over your chest.  Slightly tip your chin down without bending your neck.  Tense your  abdominals and slowly lift your trunk high enough to just clear your shoulder blades. Lifting higher can put excessive stress on the lower back and does not further strengthen your abdominal muscles.  Control your return to the starting position.  STRENGTHENING - Quadruped, Opposite UE/LE Lift   Assume a hands and knees position on a firm surface. Keep your hands under your shoulders and your knees under your hips. You may place padding under your knees for comfort.  Find your neutral spine and gently tense your abdominal muscles so that you can maintain this position. Your shoulders and hips should form a rectangle that is parallel with the floor and is not twisted.  Keeping your trunk steady, lift your right hand no higher than your shoulder and then your left leg no higher than your hip. Make sure you are not holding your breath.   Continuing to keep your abdominal muscles tense and your back steady, slowly return to your starting position. Repeat with the opposite arm and leg.  STRENGTHENING - Lower Abdominals, Double Knee Lift  Lie on a firm bed or floor. Keeping your legs in front of you, bend your  knees so they are both pointed toward the ceiling and your feet are flat on the floor.  Tense your abdominal muscles to brace your lower back and slowly lift both of your knees until they come over your hips. Be certain not to hold your breath.  POSTURE AND BODY MECHANICS CONSIDERATIONS - Low Back Strain Keeping correct posture when sitting, standing or completing your activities will reduce the stress put on different body tissues, allowing injured tissues a chance to heal and limiting painful experiences. The following are general guidelines for improved posture. Your physician or physical therapist will provide you with any instructions specific to your needs. While reading these guidelines, remember:  The exercises prescribed by your provider will help you have the flexibility and strength to maintain correct postures.  The correct posture provides the best environment for your joints to work. All of your joints have less wear and tear when properly supported by a spine with good posture. This means you will experience a healthier, less painful body.  Correct posture must be practiced with all of your activities, especially prolonged sitting and standing. Correct posture is as important when doing repetitive low-stress activities (typing) as it is when doing a single heavy-load activity (lifting). RESTING POSITIONS Consider which positions are most painful for you when choosing a resting position. If you have pain with flexion-based activities (sitting, bending, stooping, squatting), choose a position that allows you to rest in a less flexed posture. You would want to avoid curling into a fetal position on your side. If your pain worsens with extension-based activities (prolonged standing, working overhead), avoid resting in an extended position such as sleeping on your stomach. Most people will find more comfort when they rest with their spine in a more neutral position, neither too rounded nor too  arched. Lying on a non-sagging bed on your side with a pillow between your knees, or on your back with a pillow under your knees will often provide some relief. Keep in mind, being in any one position for a prolonged period of time, no matter how correct your posture, can still lead to stiffness. PROPER SITTING POSTURE In order to minimize stress and discomfort on your spine, you must sit with correct posture. Sitting with good posture should be effortless for a healthy body. Returning to good posture is  a gradual process. Many people can work toward this most comfortably by using various supports until they have the flexibility and strength to maintain this posture on their own. When sitting with proper posture, your ears will fall over your shoulders and your shoulders will fall over your hips. You should use the back of the chair to support your upper back. Your lower back will be in a neutral position, just slightly arched. You may place a small pillow or folded towel at the base of your lower back for support.  When working at a desk, create an environment that supports good, upright posture. Without extra support, muscles tire, which leads to excessive strain on joints and other tissues. Keep these recommendations in mind: CHAIR:  A chair should be able to slide under your desk when your back makes contact with the back of the chair. This allows you to work closely.  The chair's height should allow your eyes to be level with the upper part of your monitor and your hands to be slightly lower than your elbows. BODY POSITION  Your feet should make contact with the floor. If this is not possible, use a foot rest.  Keep your ears over your shoulders. This will reduce stress on your neck and lower back. INCORRECT SITTING POSTURES  If you are feeling tired and unable to assume a healthy sitting posture, do not slouch or slump. This puts excessive strain on your back tissues, causing more damage and  pain. Healthier options include:  Using more support, like a lumbar pillow.  Switching tasks to something that requires you to be upright or walking.  Talking a brief walk.  Lying down to rest in a neutral-spine position. PROLONGED STANDING WHILE SLIGHTLY LEANING FORWARD  When completing a task that requires you to lean forward while standing in one place for a long time, place either foot up on a stationary 2-4 inch high object to help maintain the best posture. When both feet are on the ground, the lower back tends to lose its slight inward curve. If this curve flattens (or becomes too large), then the back and your other joints will experience too much stress, tire more quickly, and can cause pain. CORRECT STANDING POSTURES Proper standing posture should be assumed with all daily activities, even if they only take a few moments, like when brushing your teeth. As in sitting, your ears should fall over your shoulders and your shoulders should fall over your hips. You should keep a slight tension in your abdominal muscles to brace your spine. Your tailbone should point down to the ground, not behind your body, resulting in an over-extended swayback posture.  INCORRECT STANDING POSTURES  Common incorrect standing postures include a forward head, locked knees and/or an excessive swayback. WALKING Walk with an upright posture. Your ears, shoulders and hips should all line-up. PROLONGED ACTIVITY IN A FLEXED POSITION When completing a task that requires you to bend forward at your waist or lean over a low surface, try to find a way to stabilize 3 out of 4 of your limbs. You can place a hand or elbow on your thigh or rest a knee on the surface you are reaching across. This will provide you more stability so that your muscles do not fatigue as quickly. By keeping your knees relaxed, or slightly bent, you will also reduce stress across your lower back. CORRECT LIFTING TECHNIQUES DO :   Assume a wide  stance. This will provide you more stability  and the opportunity to get as close as possible to the object which you are lifting.  Tense your abdominals to brace your spine. Bend at the knees and hips. Keeping your back locked in a neutral-spine position, lift using your leg muscles. Lift with your legs, keeping your back straight.  Test the weight of unknown objects before attempting to lift them.  Try to keep your elbows locked down at your sides in order get the best strength from your shoulders when carrying an object.  Always ask for help when lifting heavy or awkward objects. INCORRECT LIFTING TECHNIQUES DO NOT:   Lock your knees when lifting, even if it is a small object.  Bend and twist. Pivot at your feet or move your feet when needing to change directions.  Assume that you can safely pick up even a paper clip without proper posture.       IF you received an x-ray today, you will receive an invoice from [facility]. Please contact [facility] at [phone] with questions or concerns regarding your invoice.   IF you received labwork today, you will receive an invoice from [facility]. Please contact [facility] at [phone] with questions or concerns regarding your invoice.   Our billing staff will not be able to assist you with questions regarding bills from these companies.  You will be contacted with the lab results as soon as they are available. The fastest way to get your results is to activate your My Chart account. Instructions are located on the last page of this paperwork. If you have not heard from us regarding the results in 2 weeks, please contact this office.       IF you received an x-ray today, you will receive an invoice from [facility]. Please contact [facility] at [phone] with questions or concerns regarding your invoice.   IF you received labwork today, you will receive an invoice from [facility]. Please contact  [facility] at [phone] with questions or concerns regarding your invoice.   Our billing staff will not be able to assist you with questions regarding bills from these companies.  You will be contacted with the lab results as soon as they are available. The fastest way to get your results is to activate your My Chart account. Instructions are located on the last page of this paperwork. If you have not heard from us regarding the results in 2 weeks, please contact this office.

## 2017-08-15 NOTE — Progress Notes (Deleted)
Subjective:    Sherin Quarrynnette R Culotta is a 61 y.o. female who presents for evaluation of low back pain. The patient has had {history; pain back:5285::"recurrent self limited episodes of low back pain in the past"}. Symptoms have been present for {1-10:13787} {units:19031} and are {clinical course - history:17::"unchanged"}.  Onset was related to / precipitated by {causes; back pain:32249::"no known injury"}. The pain is located in the {back pain location:31199} and {radiation:20410}. The pain is described as {pain quality:31200} and occurs {timing:31009}. {Pain rating:20411} Symptoms are exacerbated by {causes; aggravators pain back:31424}. Symptoms are improved by {pain treatments:32172}. She has also tried {pain treatments:32172} which provided no symptom relief. She has {back pain associated symptoms neuro:31426} associated with the back pain. {red flag Hx:20412}  {Common ambulatory SmartLinks:19316}  Review of Systems {ros - complete:30496}    Objective:   {Exam; back exam:5796::"Full range of motion without pain, no tenderness, no spasm, no curvature.","Normal reflexes, gait, strength and negative straight-leg raise."}    Assessment:    {back diagnosis:16452}    Plan:    {Plan; back pain:10213}

## 2017-12-09 ENCOUNTER — Ambulatory Visit (INDEPENDENT_AMBULATORY_CARE_PROVIDER_SITE_OTHER): Payer: BC Managed Care – PPO | Admitting: Physician Assistant

## 2017-12-09 ENCOUNTER — Encounter: Payer: Self-pay | Admitting: Physician Assistant

## 2017-12-09 ENCOUNTER — Other Ambulatory Visit: Payer: Self-pay

## 2017-12-09 VITALS — BP 136/78 | HR 101 | Temp 98.0°F | Resp 16 | Ht 66.34 in | Wt 219.0 lb

## 2017-12-09 DIAGNOSIS — E119 Type 2 diabetes mellitus without complications: Secondary | ICD-10-CM

## 2017-12-09 DIAGNOSIS — M7989 Other specified soft tissue disorders: Secondary | ICD-10-CM

## 2017-12-09 DIAGNOSIS — Z114 Encounter for screening for human immunodeficiency virus [HIV]: Secondary | ICD-10-CM | POA: Diagnosis not present

## 2017-12-09 DIAGNOSIS — Z1159 Encounter for screening for other viral diseases: Secondary | ICD-10-CM | POA: Diagnosis not present

## 2017-12-09 DIAGNOSIS — Z0000 Encounter for general adult medical examination without abnormal findings: Secondary | ICD-10-CM

## 2017-12-09 LAB — POCT URINALYSIS DIP (MANUAL ENTRY)
BILIRUBIN UA: NEGATIVE
BILIRUBIN UA: NEGATIVE mg/dL
Blood, UA: NEGATIVE
GLUCOSE UA: NEGATIVE mg/dL
LEUKOCYTES UA: NEGATIVE
Nitrite, UA: NEGATIVE
PH UA: 5.5 (ref 5.0–8.0)
Protein Ur, POC: NEGATIVE mg/dL
Spec Grav, UA: >=1.03 — AB (ref 1.010–1.025)
Urobilinogen, UA: 0.2 E.U./dL

## 2017-12-09 NOTE — Progress Notes (Signed)
Anna Johns  MRN: [id number] DOB: April 08, 1956  Subjective:  Pt is a 62 y.o. female who presents for annual physical exam. Pt is fasting today. She is postmenopausal.   Diet: Pork chop, fish, chicken, spinach, cabbage, grapes, strawberries, bananas. Drinks orange juice and lots of water. Will occassionally eat dairy.   Exercise: Will walk about 3-4 times a week.   BM: Daily.  Sleep: 6 hours  Last dental exam: 2019 Last vision exam: 2019 Last pap smear: Years ago Last mammogram: 2018 Last colonoscopy: 2014, normal  Vaccinations      Tetanus: >10 years ago      Pneumovax: Never      Zostavax: Never  Chronic medical conditions: 1) Diabetes: Pt was unaware of dx of diabetes. She thought she was prediabetic and has been controlling it with dietary changes. Tried metformin IR In the past and had diarrhea so stopped. Has not had A1C checked in one year. Last one was 6.9.   2) Urinary Incontinence: Controlled on oxybutynin.   3) Bilateral ankle swelling x months: Patient stands on her feet all day at work.  Swelling is worse at the end of the day.  Improved with elevating feet.  Has started wearing compression socks which also helps.  Denies overlying redness, warmth, pain, numbness, tingling, shortness of breath, chest pain, and orthopnea. Patient Active Problem List   Diagnosis Date Noted  . Uncontrolled type 2 diabetes mellitus with hyperglycemia, without long-term current use of insulin ([place]) 09/15/2015    Current Outpatient Medications on File Prior to Visit  Medication Sig Dispense Refill  . ibuprofen (ADVIL,MOTRIN) 600 MG tablet Take 1 tablet (600 mg total) by mouth every 8 (eight) hours as needed. With food 30 tablet 0  . oxybutynin (DITROPAN-XL) 10 MG 24 hr tablet Take 1 tablet (10 mg total) by mouth at bedtime. 90 tablet 3   No current facility-administered medications on file prior to visit.     Allergies  Allergen Reactions  . Fish Allergy     Muscles and  Bluefish    Social History   Socioeconomic History  . Marital status: Divorced    Spouse name: Not on file  . Number of children: 2  . Years of education: Not on file  . Highest education level: Not on file  Occupational History  . Occupation: Pharmacist, hospital  Social Needs  . Financial resource strain: Not hard at all  . Food insecurity:    Worry: Never true    Inability: Never true  . Transportation needs:    Medical: No    Non-medical: No  Tobacco Use  . Smoking status: Never Smoker  . Smokeless tobacco: Never Used  Substance and Sexual Activity  . Alcohol use: No  . Drug use: No  . Sexual activity: Not Currently    Partners: Male    Birth control/protection: Surgical  Lifestyle  . Physical activity:    Days per week: 3 days    Minutes per session: 30 min  . Stress: To some extent  Relationships  . Social connections:    Talks on phone: More than three times a week    Gets together: More than three times a week    Attends religious service: More than 4 times per year    Active member of club or organization: Yes    Attends meetings of clubs or organizations: More than 4 times per year    Relationship status: Divorced  Other Topics Concern  . Not on  file  Social History Narrative   Divorced after 25 years of marriage. Has two adult children. Her son lives with her at home. Works a Pharmacist, hospital with [employer].     Past Surgical History:  Procedure Laterality Date  . ABDOMINAL HYSTERECTOMY     fibroids; ovaries intact.  . ACHILLES TENDON SURGERY Right 03/02/2017   Procedure: ACHILLES TENDON REPAIR, RIGHT;  Surgeon: Ninetta Lights, MD;  Location: [place];  Service: Orthopedics;  Laterality: Right;  . BONE EXCISION Right 03/02/2017   Procedure: EXCISION PARTIAL BONE TALUS/CALCANEUS RIGHT FOOT;  Surgeon: Ninetta Lights, MD;  Location: [facility];  Service: Orthopedics;  Laterality: Right;  . CHOLECYSTECTOMY    . CHOLECYSTECTOMY     . KNEE SURGERY      Family History  Problem Relation Age of Onset  . COPD Mother   . Heart disease Mother        CHF  . Multiple myeloma Father   . Cancer Father        bone  . Lupus Brother     Review of Systems  Constitutional: Negative for activity change, appetite change, chills, diaphoresis, fatigue, fever and unexpected weight change.  HENT: Negative for congestion, dental problem, drooling, ear discharge, ear pain, facial swelling, hearing loss, mouth sores, nosebleeds, postnasal drip, rhinorrhea, sinus pressure, sinus pain, sneezing, sore throat, tinnitus, trouble swallowing and voice change.   Eyes: Negative for photophobia, pain, discharge, redness, itching and visual disturbance.  Respiratory: Negative for apnea, cough, choking, chest tightness, shortness of breath, wheezing and stridor.   Cardiovascular: Positive for leg swelling. Negative for chest pain and palpitations.  Gastrointestinal: Negative for abdominal distention, abdominal pain, anal bleeding, blood in stool, constipation, diarrhea, nausea, rectal pain and vomiting.  Endocrine: Negative for cold intolerance, heat intolerance, polydipsia, polyphagia and polyuria.  Genitourinary: Negative for decreased urine volume, difficulty urinating, dyspareunia, dysuria, enuresis, flank pain, frequency, genital sores, hematuria, menstrual problem, pelvic pain, urgency, vaginal bleeding, vaginal discharge and vaginal pain.  Musculoskeletal: Negative for arthralgias, back pain, gait problem, joint swelling, myalgias, neck pain and neck stiffness.  Skin: Negative for color change, pallor, rash and wound.  Allergic/Immunologic: Negative for environmental allergies, food allergies and immunocompromised state.  Neurological: Negative for dizziness, tremors, seizures, syncope, facial asymmetry, speech difficulty, weakness, light-headedness, numbness and headaches.  Hematological: Negative for adenopathy. Does not bruise/bleed  easily.  Psychiatric/Behavioral: Negative for agitation, behavioral problems, confusion, decreased concentration, dysphoric mood, hallucinations, self-injury, sleep disturbance and suicidal ideas. The patient is not nervous/anxious and is not hyperactive.     Objective:  BP 136/78 (BP Location: Right Arm)   Pulse (!) 101   Temp 98 F (36.7 C) (Oral)   Resp 16   Ht 5' 6.34" (1.685 m)   Wt 219 lb (99.3 kg)   SpO2 98%   BMI 34.99 kg/m   Physical Exam  Constitutional: She is oriented to person, place, and time and well-developed, well-nourished, and in no distress.  HENT:  Head: Normocephalic and atraumatic.  Right Ear: Hearing, tympanic membrane, external ear and ear canal normal.  Left Ear: Hearing, tympanic membrane, external ear and ear canal normal.  Nose: Nose normal.  Mouth/Throat: Uvula is midline, oropharynx is clear and moist and mucous membranes are normal. No oropharyngeal exudate.  Eyes: Pupils are equal, round, and reactive to light. Conjunctivae, EOM and lids are normal. No scleral icterus.  Neck: Trachea normal and normal range of motion. No thyroid mass and no thyromegaly  present.  Cardiovascular: Normal rate, regular rhythm, normal heart sounds and intact distal pulses.  Pulmonary/Chest: Effort normal and breath sounds normal. Right breast exhibits no inverted nipple, no mass, no nipple discharge, no skin change and no tenderness. Left breast exhibits no inverted nipple, no mass, no nipple discharge, no skin change and no tenderness. Breasts are symmetrical.  Abdominal: Soft. Normal appearance and bowel sounds are normal. There is no tenderness.  Genitourinary: Vagina normal, right adnexa normal, left adnexa normal and vulva normal.  Genitourinary Comments: S/p hysterectomy   Musculoskeletal:       Right ankle: She exhibits swelling ( moderate).       Left ankle: She exhibits swelling (moderate).       Right lower leg: She exhibits no edema.       Left lower leg: She  exhibits no edema.  No pitting edema in bilateral lower extremities. No overlying erythema or warmth.  No tenderness with palpation of bilateral lower extremities.  Lymphadenopathy:       Head (right side): No tonsillar, no preauricular, no posterior auricular and no occipital adenopathy present.       Head (left side): No tonsillar, no preauricular, no posterior auricular and no occipital adenopathy present.    She has no cervical adenopathy.       Right: No supraclavicular adenopathy present.       Left: No supraclavicular adenopathy present.  Neurological: She is alert and oriented to person, place, and time. She has normal sensation, normal strength and normal reflexes. Gait normal.  Skin: Skin is warm and dry.  Psychiatric: Affect normal.    Visual Acuity Screening   Right eye Left eye Both eyes  Without correction:     With correction: _0    Assessment and Plan :  Discussed healthy lifestyle, diet, exercise, preventative care, vaccinations, and addressed patient's concerns. Plan for follow up in 2 weeks for proper diabetes follow up. Otherwise, plan for specific conditions below.  1. Annual physical exam 2. Type 2 diabetes mellitus without complication, without long-term current use of insulin ([place]) Labs pending. Plan to follow up in office for proper diabetes visit depending on A1C. Declines pneumovax and Tdap vaccine.  - CBC with Differential/Platelet - CMP14+EGFR - Lipid panel - Hemoglobin A1c - TSH - POCT urinalysis dipstick - Microalbumin/Creatinine Ratio, Urine  3. Need for hepatitis C screening test - Hepatitis C antibody 4. Screening for HIV (human immunodeficiency virus) - HIV antibody 5. Swelling of both lower extremities Labs pending. Hx and PE findings consistent with dependent edema. Continue elevating at home and compression stockings while at work.  - Brain natriuretic peptide  Tenna Delaine, PA-C  [facility] 12/10/2017 2:08 PM

## 2017-12-09 NOTE — Patient Instructions (Addendum)
We have collected labs today and should have those results in one week. For lower leg swelling, we are checking some additional labs. Please continue to keep feet elevated at home and use compression stockings. Work on increasing your exercise.   In terms of elevated blood pressure, I would like you to check your blood pressure at least a couple times over the next week outside of the office and document these values. It is best if you check the blood pressure at different times in the day. Your goal is <140/90. If your values are consistently above this goal, please return to office for further evaluation. If you start to have chest pain, blurred vision, shortness of breath, severe headache, lower leg swelling, or nausea/vomiting please seek care immediately here or at the ED.     Health Maintenance for Postmenopausal Women Menopause is a normal process in which your reproductive ability comes to an end. This process happens gradually over a span of months to years, usually between the ages of 56 and 39. Menopause is complete when you have missed 12 consecutive menstrual periods. It is important to talk with your health care provider about some of the most common conditions that affect postmenopausal women, such as heart disease, cancer, and bone loss (osteoporosis). Adopting a healthy lifestyle and getting preventive care can help to promote your health and wellness. Those actions can also lower your chances of developing some of these common conditions. What should I know about menopause? During menopause, you may experience a number of symptoms, such as:  Moderate-to-severe hot flashes.  Night sweats.  Decrease in sex drive.  Mood swings.  Headaches.  Tiredness.  Irritability.  Memory problems.  Insomnia.  Choosing to treat or not to treat menopausal changes is an individual decision that you make with your health care provider. What should I know about hormone replacement  therapy and supplements? Hormone therapy products are effective for treating symptoms that are associated with menopause, such as hot flashes and night sweats. Hormone replacement carries certain risks, especially as you become older. If you are thinking about using estrogen or estrogen with progestin treatments, discuss the benefits and risks with your health care provider. What should I know about heart disease and stroke? Heart disease, heart attack, and stroke become more likely as you age. This may be due, in part, to the hormonal changes that your body experiences during menopause. These can affect how your body processes dietary fats, triglycerides, and cholesterol. Heart attack and stroke are both medical emergencies. There are many things that you can do to help prevent heart disease and stroke:  Have your blood pressure checked at least every 1-2 years. High blood pressure causes heart disease and increases the risk of stroke.  If you are 59-26 years old, ask your health care provider if you should take aspirin to prevent a heart attack or a stroke.  Do not use any tobacco products, including cigarettes, chewing tobacco, or electronic cigarettes. If you need help quitting, ask your health care provider.  It is important to eat a healthy diet and maintain a healthy weight. ? Be sure to include plenty of vegetables, fruits, low-fat dairy products, and lean protein. ? Avoid eating foods that are high in solid fats, added sugars, or salt (sodium).  Get regular exercise. This is one of the most important things that you can do for your health. ? Try to exercise for at least 150 minutes each week. The type of exercise that  you do should increase your heart rate and make you sweat. This is known as moderate-intensity exercise. ? Try to do strengthening exercises at least twice each week. Do these in addition to the moderate-intensity exercise.  Know your numbers.Ask your health care provider  to check your cholesterol and your blood glucose. Continue to have your blood tested as directed by your health care provider.  What should I know about cancer screening? There are several types of cancer. Take the following steps to reduce your risk and to catch any cancer development as early as possible. Breast Cancer  Practice breast self-awareness. ? This means understanding how your breasts normally appear and feel. ? It also means doing regular breast self-exams. Let your health care provider know about any changes, no matter how small.  If you are 50 or older, have a clinician do a breast exam (clinical breast exam or CBE) every year. Depending on your age, family history, and medical history, it may be recommended that you also have a yearly breast X-ray (mammogram).  If you have a family history of breast cancer, talk with your health care provider about genetic screening.  If you are at high risk for breast cancer, talk with your health care provider about having an MRI and a mammogram every year.  Breast cancer (BRCA) gene test is recommended for women who have family members with BRCA-related cancers. Results of the assessment will determine the need for genetic counseling and BRCA1 and for BRCA2 testing. BRCA-related cancers include these types: ? Breast. This occurs in males or females. ? Ovarian. ? Tubal. This may also be called fallopian tube cancer. ? Cancer of the abdominal or pelvic lining (peritoneal cancer). ? Prostate. ? Pancreatic.  Cervical, Uterine, and Ovarian Cancer Your health care provider may recommend that you be screened regularly for cancer of the pelvic organs. These include your ovaries, uterus, and vagina. This screening involves a pelvic exam, which includes checking for microscopic changes to the surface of your cervix (Pap test).  For women ages 21-65, health care providers may recommend a pelvic exam and a Pap test every three years. For women ages  30-65, they may recommend the Pap test and pelvic exam, combined with testing for human papilloma virus (HPV), every five years. Some types of HPV increase your risk of cervical cancer. Testing for HPV may also be done on women of any age who have unclear Pap test results.  Other health care providers may not recommend any screening for nonpregnant women who are considered low risk for pelvic cancer and have no symptoms. Ask your health care provider if a screening pelvic exam is right for you.  If you have had past treatment for cervical cancer or a condition that could lead to cancer, you need Pap tests and screening for cancer for at least 20 years after your treatment. If Pap tests have been discontinued for you, your risk factors (such as having a new sexual partner) need to be reassessed to determine if you should start having screenings again. Some women have medical problems that increase the chance of getting cervical cancer. In these cases, your health care provider may recommend that you have screening and Pap tests more often.  If you have a family history of uterine cancer or ovarian cancer, talk with your health care provider about genetic screening.  If you have vaginal bleeding after reaching menopause, tell your health care provider.  There are currently no reliable tests available to screen  for ovarian cancer.  Lung Cancer Lung cancer screening is recommended for adults 38-47 years old who are at high risk for lung cancer because of a history of smoking. A yearly low-dose CT scan of the lungs is recommended if you:  Currently smoke.  Have a history of at least 30 pack-years of smoking and you currently smoke or have quit within the past 15 years. A pack-year is smoking an average of one pack of cigarettes per day for one year.  Yearly screening should:  Continue until it has been 15 years since you quit.  Stop if you develop a health problem that would prevent you from having  lung cancer treatment.  Colorectal Cancer  This type of cancer can be detected and can often be prevented.  Routine colorectal cancer screening usually begins at age 37 and continues through age 54.  If you have risk factors for colon cancer, your health care provider may recommend that you be screened at an earlier age.  If you have a family history of colorectal cancer, talk with your health care provider about genetic screening.  Your health care provider may also recommend using home test kits to check for hidden blood in your stool.  A small camera at the end of a tube can be used to examine your colon directly (sigmoidoscopy or colonoscopy). This is done to check for the earliest forms of colorectal cancer.  Direct examination of the colon should be repeated every 5-10 years until age 66. However, if early forms of precancerous polyps or small growths are found or if you have a family history or genetic risk for colorectal cancer, you may need to be screened more often.  Skin Cancer  Check your skin from head to toe regularly.  Monitor any moles. Be sure to tell your health care provider: ? About any new moles or changes in moles, especially if there is a change in a mole's shape or color. ? If you have a mole that is larger than the size of a pencil eraser.  If any of your family members has a history of skin cancer, especially at a young age, talk with your health care provider about genetic screening.  Always use sunscreen. Apply sunscreen liberally and repeatedly throughout the day.  Whenever you are outside, protect yourself by wearing long sleeves, pants, a wide-brimmed hat, and sunglasses.  What should I know about osteoporosis? Osteoporosis is a condition in which bone destruction happens more quickly than new bone creation. After menopause, you may be at an increased risk for osteoporosis. To help prevent osteoporosis or the bone fractures that can happen because of  osteoporosis, the following is recommended:  If you are 28-12 years old, get at least 1,000 mg of calcium and at least 600 mg of vitamin D per day.  If you are older than age 68 but younger than age 26, get at least 1,200 mg of calcium and at least 600 mg of vitamin D per day.  If you are older than age 46, get at least 1,200 mg of calcium and at least 800 mg of vitamin D per day.  Smoking and excessive alcohol intake increase the risk of osteoporosis. Eat foods that are rich in calcium and vitamin D, and do weight-bearing exercises several times each week as directed by your health care provider. What should I know about how menopause affects my mental health? Depression may occur at any age, but it is more common as you become  older. Common symptoms of depression include:  Low or sad mood.  Changes in sleep patterns.  Changes in appetite or eating patterns.  Feeling an overall lack of motivation or enjoyment of activities that you previously enjoyed.  Frequent crying spells.  Talk with your health care provider if you think that you are experiencing depression. What should I know about immunizations? It is important that you get and maintain your immunizations. These include:  Tetanus, diphtheria, and pertussis (Tdap) booster vaccine.  Influenza every year before the flu season begins.  Pneumonia vaccine.  Shingles vaccine.  Your health care provider may also recommend other immunizations. This information is not intended to replace advice given to you by your health care provider. Make sure you discuss any questions you have with your health care provider. Document Released: 10/21/2005 Document Revised: 03/18/2016 Document Reviewed: 06/02/2015 Elsevier Interactive Patient Education  2018 [employer].  IF you received an x-ray today, you will receive an invoice from [facility]. Please contact [facility] at [phone] with questions or concerns  regarding your invoice.   IF you received labwork today, you will receive an invoice from [facility]. Please contact [facility] at [phone] with questions or concerns regarding your invoice.   Our billing staff will not be able to assist you with questions regarding bills from these companies.  You will be contacted with the lab results as soon as they are available. The fastest way to get your results is to activate your My Chart account. Instructions are located on the last page of this paperwork. If you have not heard from [place] regarding the results in 2 weeks, please contact this office.

## 2017-12-10 LAB — LIPID PANEL
CHOLESTEROL TOTAL: 194 mg/dL (ref 100–199)
Chol/HDL Ratio: 4 ratio (ref 0.0–4.4)
HDL: 49 mg/dL (ref >39)
LDL Calculated: 121 mg/dL — ABNORMAL HIGH (ref 0–99)
Triglycerides: 119 mg/dL (ref 0–149)
VLDL Cholesterol Cal: 24 mg/dL (ref 5–40)

## 2017-12-10 LAB — CBC WITH DIFFERENTIAL/PLATELET
BASOS: 0 %
Basophils Absolute: 0 10*3/uL (ref 0.0–0.2)
EOS (ABSOLUTE): 0 10*3/uL (ref 0.0–0.4)
Eos: 1 %
HEMOGLOBIN: 12.4 g/dL (ref 11.1–15.9)
Hematocrit: 37.2 % (ref 34.0–46.6)
IMMATURE GRANS (ABS): 0 10*3/uL (ref 0.0–0.1)
Immature Granulocytes: 0 %
LYMPHS ABS: 2 10*3/uL (ref 0.7–3.1)
Lymphs: 38 %
MCH: 29 pg (ref 26.6–33.0)
MCHC: 33.3 g/dL (ref 31.5–35.7)
MCV: 87 fL (ref 79–97)
Monocytes Absolute: 0.3 10*3/uL (ref 0.1–0.9)
Monocytes: 6 %
NEUTROS ABS: 2.9 10*3/uL (ref 1.4–7.0)
Neutrophils: 55 %
Platelets: 274 10*3/uL (ref 150–379)
RBC: 4.28 x10E6/uL (ref 3.77–5.28)
RDW: 13.6 % (ref 12.3–15.4)
WBC: 5.2 10*3/uL (ref 3.4–10.8)

## 2017-12-10 LAB — CMP14+EGFR
A/G RATIO: 1.4 (ref 1.2–2.2)
ALK PHOS: 114 IU/L (ref 39–117)
ALT: 15 IU/L (ref 0–32)
AST: 15 IU/L (ref 0–40)
Albumin: 4.4 g/dL (ref 3.6–4.8)
BILIRUBIN TOTAL: 0.8 mg/dL (ref 0.0–1.2)
BUN/Creatinine Ratio: 13 (ref 12–28)
BUN: 12 mg/dL (ref 8–27)
CHLORIDE: 104 mmol/L (ref 96–106)
CO2: 21 mmol/L (ref 20–29)
Calcium: 9.7 mg/dL (ref 8.7–10.3)
Creatinine, Ser: 0.89 mg/dL (ref 0.57–1.00)
GFR calc Af Amer: 81 mL/min/{1.73_m2} (ref >59)
GFR calc non Af Amer: 71 mL/min/{1.73_m2} (ref >59)
GLUCOSE: 143 mg/dL — AB (ref 65–99)
Globulin, Total: 3.1 g/dL (ref 1.5–4.5)
POTASSIUM: 4.2 mmol/L (ref 3.5–5.2)
SODIUM: 141 mmol/L (ref 134–144)
Total Protein: 7.5 g/dL (ref 6.0–8.5)

## 2017-12-10 LAB — HIV ANTIBODY (ROUTINE TESTING W REFLEX): HIV SCREEN 4TH GENERATION: NONREACTIVE

## 2017-12-10 LAB — MICROALBUMIN / CREATININE URINE RATIO
CREATININE, UR: 278.9 mg/dL
Microalb/Creat Ratio: 9.6 mg/g creat (ref 0.0–30.0)
Microalbumin, Urine: 26.9 ug/mL

## 2017-12-10 LAB — HEMOGLOBIN A1C
ESTIMATED AVERAGE GLUCOSE: 183 mg/dL
Hgb A1c MFr Bld: 8 % — ABNORMAL HIGH (ref 4.8–5.6)

## 2017-12-10 LAB — HEPATITIS C ANTIBODY

## 2017-12-10 LAB — TSH: TSH: 1.05 u[IU]/mL (ref 0.450–4.500)

## 2017-12-11 LAB — BRAIN NATRIURETIC PEPTIDE: BNP: 11.8 pg/mL (ref 0.0–100.0)

## 2017-12-12 ENCOUNTER — Encounter: Payer: Self-pay | Admitting: Physician Assistant

## 2017-12-14 ENCOUNTER — Telehealth: Payer: Self-pay | Admitting: Physician Assistant

## 2017-12-14 NOTE — Telephone Encounter (Signed)
Please schedule per request of [place] on lab letter.

## 2017-12-14 NOTE — Telephone Encounter (Signed)
Copied from CRM [id number]. Topic: Quick Communication - See Telephone Encounter >> Dec 14, 2017 12:04 PM Oneal GroutSebastian, Jennifer S wrote: CRM for notification. See Telephone encounter for: 12/14/17. Patient would rather call something, has tried metformin before and does not want to take that again, states she was just there on Saturday. Would like to speak with nurse regarding labs.

## 2017-12-15 NOTE — Telephone Encounter (Signed)
Called pt to try and schedule her with Barnett AbuWiseman for an OV for med refill. When pt calls back, please reschedule her at her convenience with [place] Wis[facility].  Thanks!

## 2017-12-18 ENCOUNTER — Encounter: Payer: Self-pay | Admitting: *Deleted

## 2017-12-22 ENCOUNTER — Encounter: Payer: Self-pay | Admitting: Family Medicine

## 2017-12-22 ENCOUNTER — Ambulatory Visit: Payer: BC Managed Care – PPO | Admitting: Family Medicine

## 2017-12-22 ENCOUNTER — Other Ambulatory Visit: Payer: Self-pay

## 2017-12-22 VITALS — BP 150/78 | HR 100 | Temp 98.9°F | Ht 66.5 in | Wt 218.8 lb

## 2017-12-22 DIAGNOSIS — J0100 Acute maxillary sinusitis, unspecified: Secondary | ICD-10-CM

## 2017-12-22 DIAGNOSIS — R030 Elevated blood-pressure reading, without diagnosis of hypertension: Secondary | ICD-10-CM

## 2017-12-22 DIAGNOSIS — E1165 Type 2 diabetes mellitus with hyperglycemia: Secondary | ICD-10-CM | POA: Diagnosis not present

## 2017-12-22 MED ORDER — AZELASTINE HCL 0.1 % NA SOLN: 1.0000 | Freq: Two times a day (BID) | NASAL | 2 refills | Status: DC

## 2017-12-22 MED ORDER — AMOXICILLIN-POT CLAVULANATE 875-125 MG PO TABS: 1.0000 | ORAL_TABLET | Freq: Two times a day (BID) | ORAL | 0 refills | Status: DC

## 2017-12-22 MED ORDER — METFORMIN HCL ER 500 MG PO TB24: 500.0000 mg | ORAL_TABLET | Freq: Every day | ORAL | 2 refills | Status: DC

## 2017-12-22 NOTE — Progress Notes (Signed)
4/12/20195:29 PM  Anna Johns Dec 10, 1955, 62 y.o. female [id number]  Chief Complaint  Patient presents with  . Sinus Problem    HAVING MUCUS, AND COUGHING  . Results    NEEDING LAB RESULTS    HPI:   Patient is a 62 y.o. female  who presents today for almost 2 weeks of thick yellow mucous, nasal congestion, sinus pressure, coughing, no fever or chills, no ear pain or sore throat. Not getting better despite OTC cold/allergy meds.   She would also like to discuss her labs done during her annual physical exam. DM2 in the past diet controlled, metformin IR gives her diarrhea. Has regained some weight since last a1c check.  Depression screen [facility] 2/9 12/22/2017 12/09/2017 08/15/2017  Decreased Interest 0 0 0  Down, Depressed, Hopeless 0 0 0  PHQ - 2 Score 0 0 0    Allergies  Allergen Reactions  . Fish Allergy     Muscles and Bluefish    Prior to Admission medications   Medication Sig Start Date End Date Taking? Authorizing Provider  oxybutynin (DITROPAN-XL) 10 MG 24 hr tablet Take 1 tablet (10 mg total) by mouth at bedtime. 11/19/16  Yes English, Colletta [place] D, PA  ibuprofen (ADVIL,MOTRIN) 600 MG tablet Take 1 tablet (600 mg total) by mouth every 8 (eight) hours as needed. With food Patient not taking: Reported on 12/22/2017 05/22/17   Forrest Moron, MD    Past Medical History:  Diagnosis Date  . Allergy   . Anemia   . Glucose intolerance (impaired glucose tolerance) 12/25/2012   HgbA1c of 6.1.  . Urinary incontinence     Past Surgical History:  Procedure Laterality Date  . ABDOMINAL HYSTERECTOMY     fibroids; ovaries intact.  . ACHILLES TENDON SURGERY Right 03/02/2017   Procedure: ACHILLES TENDON REPAIR, RIGHT;  Surgeon: Ninetta Lights, MD;  Location: [place];  Service: Orthopedics;  Laterality: Right;  . BONE EXCISION Right 03/02/2017   Procedure: EXCISION PARTIAL BONE TALUS/CALCANEUS RIGHT FOOT;  Surgeon: Ninetta Lights, MD;  Location: [place];  Service: Orthopedics;  Laterality: Right;  . CHOLECYSTECTOMY    . CHOLECYSTECTOMY    . KNEE SURGERY      Social History   Tobacco Use  . Smoking status: Never Smoker  . Smokeless tobacco: Never Used  Substance Use Topics  . Alcohol use: No    Family History  Problem Relation Age of Onset  . COPD Mother   . Heart disease Mother        CHF  . Multiple myeloma Father   . Cancer Father        bone  . Lupus Brother     ROS Per hpi  OBJECTIVE:  Blood pressure (!) 150/78, pulse 100, temperature 98.9 F (37.2 C), temperature source Oral, height 5' 6.5" (1.689 m), weight 218 lb 12.8 oz (99.2 kg), SpO2 98 %.  Physical Exam  Constitutional: She is oriented to person, place, and time.  HENT:  Head: Normocephalic and atraumatic.  Right Ear: Hearing, tympanic membrane, external ear and ear canal normal.  Left Ear: Hearing, tympanic membrane, external ear and ear canal normal.  Nose: Mucosal edema and rhinorrhea present. Right sinus exhibits maxillary sinus tenderness. Left sinus exhibits maxillary sinus tenderness.  Mouth/Throat: Oropharynx is clear and moist.  Eyes: Pupils are equal, round, and reactive to light. EOM are normal.  Neck: Neck supple.  Cardiovascular: Normal rate, regular rhythm and normal heart sounds.  Exam reveals no gallop and no friction rub.  No murmur heard. Pulmonary/Chest: Effort normal and breath sounds normal. She has no wheezes. She has no rales.  Lymphadenopathy:    She has no cervical adenopathy.  Neurological: She is alert and oriented to person, place, and time.  Skin: Skin is warm and dry.    a1c 8.0  ASSESSMENT and PLAN  1. Acute non-recurrent maxillary sinusitis Discussed supportive measures, new meds r/se/b and RTC precautions. Patient educational handout given. - amoxicillin-clavulanate (AUGMENTIN) 875-125 MG tablet; Take 1 tablet by mouth 2 (two) times daily. - azelastine (ASTELIN) 0.1 % nasal spray; Place 1 spray  into both nostrils 2 (two) times daily. Use in each nostril as directed  2. Uncontrolled type 2 diabetes mellitus with hyperglycemia, without long-term current use of insulin ([place]) Discussed importance of low carb diet, regular exercise and healthy weight.  - metFORMIN (GLUCOPHAGE-XR) 500 MG 24 hr tablet; Take 1 tablet (500 mg total) by mouth daily with breakfast.  3. Elevated BP without diagnosis of hypertension Discussed avoidance of oral decongestants, patient will monitor BP at home, re-evaluate BP at next visit.    Return in about 3 months (around 03/23/2018).    Rutherford Guys, MD Primary Care at [place] [place], [place] [address] Ph.  [phone] Fax [phone]

## 2017-12-22 NOTE — Patient Instructions (Addendum)
IF you received an x-ray today, you will receive an invoice from [facility]. Please contact [facility] at [phone] with questions or concerns regarding your invoice.   IF you received labwork today, you will receive an invoice from [facility]. Please contact [facility] at [phone] with questions or concerns regarding your invoice.   Our billing staff will not be able to assist you with questions regarding bills from these companies.  You will be contacted with the lab results as soon as they are available. The fastest way to get your results is to activate your My Chart account. Instructions are located on the last page of this paperwork. If you have not heard from [place] regarding the results in 2 weeks, please contact this office.     Sinusitis, Adult Sinusitis is soreness and inflammation of your sinuses. Sinuses are hollow spaces in the bones around your face. They are located:  Around your eyes.  In the middle of your forehead.  Behind your nose.  In your cheekbones.  Your sinuses and nasal passages are lined with a stringy fluid (mucus). Mucus normally drains out of your sinuses. When your nasal tissues get inflamed or swollen, the mucus can get trapped or blocked so air cannot flow through your sinuses. This lets bacteria, viruses, and funguses grow, and that leads to infection. Follow these instructions at home: Medicines  Take, use, or apply over-the-counter and prescription medicines only as told by your doctor. These may include nasal sprays.  If you were prescribed an antibiotic medicine, take it as told by your doctor. Do not stop taking the antibiotic even if you start to feel better. Hydrate and Humidify  Drink enough water to keep your pee (urine) clear or pale yellow.  Use a cool mist humidifier to keep the humidity level in your home above 50%.  Breathe in steam for 10-15 minutes, 3-4 times a day or as told by your doctor. You can do  this in the bathroom while a hot shower is running.  Try not to spend time in cool or dry air. Rest  Rest as much as possible.  Sleep with your head raised (elevated).  Make sure to get enough sleep each night. General instructions  Put a warm, moist washcloth on your face 3-4 times a day or as told by your doctor. This will help with discomfort.  Wash your hands often with soap and water. If there is no soap and water, use hand sanitizer.  Do not smoke. Avoid being around people who are smoking (secondhand smoke).  Keep all follow-up visits as told by your doctor. This is important. Contact a doctor if:  You have a fever.  Your symptoms get worse.  Your symptoms do not get better within 10 days. Get help right away if:  You have a very bad headache.  You cannot stop throwing up (vomiting).  You have pain or swelling around your face or eyes.  You have trouble seeing.  You feel confused.  Your neck is stiff.  You have trouble breathing. This information is not intended to replace advice given to you by your health care provider. Make sure you discuss any questions you have with your health care provider. Document Released: 02/15/2008 Document Revised: 04/24/2016 Document Reviewed: 06/24/2015 Elsevier Interactive Patient Education  2018 [employer].  Diabetes Mellitus and Nutrition When you have diabetes (diabetes mellitus), it is very important to have healthy eating habits because your blood sugar (glucose) levels are greatly  affected by what you eat and drink. Eating healthy foods in the appropriate amounts, at about the same times every day, can help you:  Control your blood glucose.  Lower your risk of heart disease.  Improve your blood pressure.  Reach or maintain a healthy weight.  Every person with diabetes is different, and each person has different needs for a meal plan. Your health care provider may recommend that you work with a diet and nutrition  specialist (dietitian) to make a meal plan that is best for you. Your meal plan may vary depending on factors such as:  The calories you need.  The medicines you take.  Your weight.  Your blood glucose, blood pressure, and cholesterol levels.  Your activity level.  Other health conditions you have, such as heart or kidney disease.  How do carbohydrates affect me? Carbohydrates affect your blood glucose level more than any other type of food. Eating carbohydrates naturally increases the amount of glucose in your blood. Carbohydrate counting is a method for keeping track of how many carbohydrates you eat. Counting carbohydrates is important to keep your blood glucose at a healthy level, especially if you use insulin or take certain oral diabetes medicines. It is important to know how many carbohydrates you can safely have in each meal. This is different for every person. Your dietitian can help you calculate how many carbohydrates you should have at each meal and for snack. Foods that contain carbohydrates include:  Bread, cereal, rice, pasta, and crackers.  Potatoes and corn.  Peas, beans, and lentils.  Milk and yogurt.  Fruit and juice.  Desserts, such as cakes, cookies, ice cream, and candy.  How does alcohol affect me? Alcohol can cause a sudden decrease in blood glucose (hypoglycemia), especially if you use insulin or take certain oral diabetes medicines. Hypoglycemia can be a life-threatening condition. Symptoms of hypoglycemia (sleepiness, dizziness, and confusion) are similar to symptoms of having too much alcohol. If your health care provider says that alcohol is safe for you, follow these guidelines:  Limit alcohol intake to no more than 1 drink per day for nonpregnant women and 2 drinks per day for men. One drink equals 12 oz of beer, 5 oz of wine, or 1 oz of hard liquor.  Do not drink on an empty stomach.  Keep yourself hydrated with water, diet soda, or unsweetened  iced tea.  Keep in mind that regular soda, juice, and other mixers may contain a lot of sugar and must be counted as carbohydrates.  What are tips for following this plan? Reading food labels  Start by checking the serving size on the label. The amount of calories, carbohydrates, fats, and other nutrients listed on the label are based on one serving of the food. Many foods contain more than one serving per package.  Check the total grams (g) of carbohydrates in one serving. You can calculate the number of servings of carbohydrates in one serving by dividing the total carbohydrates by 15. For example, if a food has 30 g of total carbohydrates, it would be equal to 2 servings of carbohydrates.  Check the number of grams (g) of saturated and trans fats in one serving. Choose foods that have low or no amount of these fats.  Check the number of milligrams (mg) of sodium in one serving. Most people should limit total sodium intake to less than 2,300 mg per day.  Always check the nutrition information of foods labeled as "low-fat" or "nonfat". These  foods may be higher in added sugar or refined carbohydrates and should be avoided.  Talk to your dietitian to identify your daily goals for nutrients listed on the label. Shopping  Avoid buying canned, premade, or processed foods. These foods tend to be high in fat, sodium, and added sugar.  Shop around the outside edge of the grocery store. This includes fresh fruits and vegetables, bulk grains, fresh meats, and fresh dairy. Cooking  Use low-heat cooking methods, such as baking, instead of high-heat cooking methods like deep frying.  Cook using healthy oils, such as olive, canola, or sunflower oil.  Avoid cooking with butter, cream, or high-fat meats. Meal planning  Eat meals and snacks regularly, preferably at the same times every day. Avoid going long periods of time without eating.  Eat foods high in fiber, such as fresh fruits, vegetables,  beans, and whole grains. Talk to your dietitian about how many servings of carbohydrates you can eat at each meal.  Eat 4-6 ounces of lean protein each day, such as lean meat, chicken, fish, eggs, or tofu. 1 ounce is equal to 1 ounce of meat, chicken, or fish, 1 egg, or 1/4 cup of tofu.  Eat some foods each day that contain healthy fats, such as avocado, nuts, seeds, and fish. Lifestyle   Check your blood glucose regularly.  Exercise at least 30 minutes 5 or more days each week, or as told by your health care provider.  Take medicines as told by your health care provider.  Do not use any products that contain nicotine or tobacco, such as cigarettes and e-cigarettes. If you need help quitting, ask your health care provider.  Work with a Veterinary surgeon or diabetes educator to identify strategies to manage stress and any emotional and social challenges. What are some questions to ask my health care provider?  Do I need to meet with a diabetes educator?  Do I need to meet with a dietitian?  What number can I call if I have questions?  When are the best times to check my blood glucose? Where to find more information:  American Diabetes Association: diabetes.org/food-and-fitness/food  Academy of Nutrition and Dietetics: https://www.vargas.com/  [employer] of Diabetes and Digestive and Kidney Diseases (NIH): FindJewelers.cz Summary  A healthy meal plan will help you control your blood glucose and maintain a healthy lifestyle.  Working with a diet and nutrition specialist (dietitian) can help you make a meal plan that is best for you.  Keep in mind that carbohydrates and alcohol have immediate effects on your blood glucose levels. It is important to count carbohydrates and to use alcohol carefully. This information is not intended to replace advice given to you by your  health care provider. Make sure you discuss any questions you have with your health care provider. Document Released: 05/26/2005 Document Revised: 10/03/2016 Document Reviewed: 10/03/2016 Elsevier Interactive Patient Education  [employer].

## 2017-12-29 ENCOUNTER — Other Ambulatory Visit: Payer: Self-pay | Admitting: Physician Assistant

## 2017-12-29 DIAGNOSIS — N3941 Urge incontinence: Secondary | ICD-10-CM

## 2018-03-26 ENCOUNTER — Other Ambulatory Visit: Payer: Self-pay | Admitting: Physician Assistant

## 2018-03-26 DIAGNOSIS — N3941 Urge incontinence: Secondary | ICD-10-CM

## 2018-04-01 ENCOUNTER — Other Ambulatory Visit: Payer: Self-pay | Admitting: Family Medicine

## 2018-04-03 NOTE — Telephone Encounter (Signed)
Metformin XR 500 mg 24 hr tablet  LOV 12/22/17 with Dr. Leretha PolSantiago.   She was started on this medication at this time.   She was for a F/U appt in July.   No appts noted for her F/U.  [phone] [address] [place] [place]

## 2018-04-28 ENCOUNTER — Other Ambulatory Visit: Payer: Self-pay | Admitting: Family Medicine

## 2018-05-23 ENCOUNTER — Other Ambulatory Visit: Payer: Self-pay | Admitting: Family Medicine

## 2018-05-23 NOTE — Telephone Encounter (Signed)
Left message for pt. To call and schedule an appointment. Was due for follow up in July.

## 2018-05-24 ENCOUNTER — Telehealth: Payer: Self-pay | Admitting: Physician Assistant

## 2018-05-24 ENCOUNTER — Other Ambulatory Visit: Payer: Self-pay

## 2018-05-24 MED ORDER — METFORMIN HCL ER 500 MG PO TB24: ORAL_TABLET | ORAL | 0 refills | Status: DC

## 2018-05-24 NOTE — Telephone Encounter (Unsigned)
Copied from CRM [phone]. Topic: Quick Communication - See Telephone Encounter >> May 24, 2018 11:52 AM Mare LoanBurton, Donna F wrote: Pt is needing a refill on her metformin she only has a few and her appt is not til 05/30/18 at 220 and she would like enough to get her thru

## 2018-05-30 ENCOUNTER — Other Ambulatory Visit: Payer: Self-pay

## 2018-05-30 ENCOUNTER — Encounter: Payer: Self-pay | Admitting: Physician Assistant

## 2018-05-30 ENCOUNTER — Ambulatory Visit: Payer: BC Managed Care – PPO | Admitting: Physician Assistant

## 2018-05-30 VITALS — BP 133/78 | HR 87 | Temp 99.5°F | Resp 20 | Ht 66.81 in | Wt 216.0 lb

## 2018-05-30 DIAGNOSIS — Z9189 Other specified personal risk factors, not elsewhere classified: Secondary | ICD-10-CM

## 2018-05-30 DIAGNOSIS — E1165 Type 2 diabetes mellitus with hyperglycemia: Secondary | ICD-10-CM | POA: Diagnosis not present

## 2018-05-30 LAB — POCT GLYCOSYLATED HEMOGLOBIN (HGB A1C): HEMOGLOBIN A1C: 7.8 % — AB (ref 4.0–5.6)

## 2018-05-30 MED ORDER — METFORMIN HCL ER 500 MG PO TB24: 2000.0000 mg | ORAL_TABLET | Freq: Every evening | ORAL | 0 refills | Status: DC

## 2018-05-30 NOTE — Progress Notes (Signed)
MRN: [id number]  Subjective:   Anna Johns is a 62 y.o. female who presents for follow up of Type 2 diabetes mellitus. Diagnosis was made 11/2017. Patient is currently managed with metformin XR 561m daily.  Has had diarrhea but has baseline diarrhea due to cholecystectomy.  Denies adverse effects including metallic taste, hypoglycemia, nausea, vomiting. Patient is checking home blood sugars. Home blood sugar records: BGs range between 100 and 120. Current symptoms include none. Patient denies hyperglycemia, increased appetite, nausea, paresthesia of the feet, polydipsia, polyuria, visual disturbances and vomiting. Patient is checking their feet daily. No foot concerns. Last diabetic eye exam eye exam: never. Diet: trying to eat more salads, has cut bread in the past 2 months, has cut out sodas in the past few weeks. Drinks juices and eats fruits. Minimal desserts. Exercise includes walking all day long at work. Denies smoking or alcohol use.   Known diabetic complications: none  Immunizations: does not get them  Patient Active Problem List   Diagnosis Date Noted  . Uncontrolled type 2 diabetes mellitus with hyperglycemia, without long-term current use of insulin ([facility] 09/15/2015   Past Medical History:  Diagnosis Date  . Allergy   . Anemia   . Glucose intolerance (impaired glucose tolerance) 12/25/2012   HgbA1c of 6.1.  . Urinary incontinence    Social History   Socioeconomic History  . Marital status: Divorced    Spouse name: Not on file  . Number of children: 2  . Years of education: Not on file  . Highest education level: Not on file  Occupational History  . Occupation: tPharmacist, hospital Social Needs  . Financial resource strain: Not hard at all  . Food insecurity:    Worry: Never true    Inability: Never true  . Transportation needs:    Medical: No    Non-medical: No  Tobacco Use  . Smoking status: Never Smoker  . Smokeless tobacco: Never Used  Substance and Sexual  Activity  . Alcohol use: No  . Drug use: Never  . Sexual activity: Not Currently    Partners: Male    Birth control/protection: Surgical  Lifestyle  . Physical activity:    Days per week: 3 days    Minutes per session: 30 min  . Stress: To some extent  Relationships  . Social connections:    Talks on phone: More than three times a week    Gets together: More than three times a week    Attends religious service: More than 4 times per year    Active member of club or organization: Yes    Attends meetings of clubs or organizations: More than 4 times per year    Relationship status: Divorced  . Intimate partner violence:    Fear of current or ex partner: No    Emotionally abused: No    Physically abused: No    Forced sexual activity: No  Other Topics Concern  . Not on file  Social History Narrative   Divorced after 25 years of marriage. Has two adult children. Her son lives with her at home. Works a tPharmacist, hospitalwith [employer]       Objective:   PHYSICAL EXAM BP 133/78   Pulse 87   Temp 99.5 F (37.5 C) (Oral)   Resp 20   Ht 5' 6.81" (1.697 m)   Wt 216 lb (98 kg)   SpO2 98%   BMI 34.02 kg/m   Physical Exam  Constitutional: She  is oriented to person, place, and time. She appears well-developed and well-nourished. No distress.  HENT:  Head: Normocephalic and atraumatic.  Mouth/Throat: Uvula is midline, oropharynx is clear and moist and mucous membranes are normal.  Eyes: Pupils are equal, round, and reactive to light. Conjunctivae and EOM are normal.  Neck: Normal range of motion.  Cardiovascular: Normal rate, regular rhythm, normal heart sounds and intact distal pulses.  Pulmonary/Chest: Effort normal and breath sounds normal. She has no wheezes. She has no rhonchi. She has no rales.  Musculoskeletal:       Right lower leg: She exhibits no swelling.       Left lower leg: She exhibits no swelling.  Neurological: She is alert and oriented to person, place,  and time.  Skin: Skin is warm and dry.  Psychiatric: She has a normal mood and affect.  Vitals reviewed.   Diabetic Foot Exam - Simple   Simple Foot Form Visual Inspection No deformities, no ulcerations, no other skin breakdown bilaterally:  Yes Sensation Testing Intact to touch and monofilament testing bilaterally:  Yes Pulse Check Posterior Tibialis and Dorsalis pulse intact bilaterally:  Yes Comments     Results for orders placed or performed in visit on 05/30/18 (from the past 24 hour(s))  POCT glycosylated hemoglobin (Hb A1C)     Status: Abnormal   Collection Time: 05/30/18  2:48 PM  Result Value Ref Range   Hemoglobin A1C 7.8 (A) 4.0 - 5.6 %   HbA1c POC (<> result, manual entry)     HbA1c, POC (prediabetic range)     HbA1c, POC (controlled diabetic range)       BP Readings from Last 3 Encounters:  05/30/18 133/78  12/22/17 (!) 150/78  12/09/17 136/78   Wt Readings from Last 3 Encounters:  05/30/18 216 lb (98 kg)  12/22/17 218 lb 12.8 oz (99.2 kg)  12/09/17 219 lb (99.3 kg)    Assessment and Plan :  1. Uncontrolled type 2 diabetes mellitus with hyperglycemia, without long-term current use of insulin (H[facility]) 2. 10 year risk of MI or stroke 7.5% or greater A1c still uncontrolled at 7.8.  This is slightly improved from 8.0 six months ago.  She is only taking metformin XR 500 mg daily.  Recommend she increase to max dose of 2000 mg daily.  Recommend she do this slowly over the next 3 weeks.  Patient appears to be in denial that her A1c is this elevated.  Reports not seeing a glucose reading over 120.  She is eating lots of fruits and juices in her diet.  Recommended diabetic nutrition classes, but she refuses saying she does not have enough time.  Spent a great deal of time discussing diagnosis of diabetes and potential complications of uncontrolled diabetes.  Also discussed importance of being on a statin and ACE inhibitor.  She declines both of these right now.  Discussed  that her ASCVD ten-year risk score is ~14%.  She acknowledges this but still refuses any additional medications.  She also declines Pneumovax vaccine.  Given educational material on diabetes and nutrition.  Recommend follow-up in 3 months for reevaluation. - CMP14+EGFR - POCT glycosylated hemoglobin (Hb A1C) - HM Diabetes Foot Exam  Side effects, risks, benefits, and alternatives of the medications and treatment plan prescribed today were discussed, and patient expressed understanding of the instructions given. No barriers to understanding were identified. Red flags discussed in detail. Pt expressed understanding regarding what to do in case of emergency/urgent symptoms.  A  total of 25 was spent in the room with the patient, greater than 50% of which was in counseling/coordination of care regarding T2DM.   Tenna Delaine, PA-C  [facility] at [facility] 05/30/2018 3:18 PM

## 2018-05-30 NOTE — Patient Instructions (Addendum)
Diabetes is a very complicated disease...lets simplify it.   An easy way to look at it to understand the complications is if you think of the extra sugar floating in your blood stream as glass shards floating through your blood stream.   Diabetes affects your small vessels first: 1) The glass shards (sugar) scrapes down the tiny blood vessels in your eyes and lead to diabetic retinopathy, the leading cause of blindness in the [place]. Diabetes is the leading cause of newly diagnosed adult (64 to 61 years of age) blindness in the [place].  2) The glass shards scratches down the tiny vessels of your legs leading to nerve damage called neuropathy and can lead to amputations of your feet. More than 60% of all non-traumatic amputations of lower limbs occur in people with diabetes.  3) Over time the small vessels in your brain are shredded and closed off, individually this does not cause any problems but over a long period of time many of the small vessels being blocked can lead to Vascular Dementia.   4) Your kidney's are a filter system and have a "net" that keeps certain things in the body and lets bad things out. Sugar shreds this net and leads to kidney damage and eventually failure. Decreasing the sugar that is destroying the net and certain blood pressure medications can help stop or decrease progression of kidney disease. Diabetes was the primary cause of kidney failure in 44 percent of all new cases in 2011.  5) Diabetes also destroys the small vessels in your penis that lead to erectile dysfunction. Eventually the vessels are so damaged that you may not be responsive to cialis or viagra.   Diabetes and your large vessels: Your larger vessels consist of your coronary arteries in your heart and the carotid vessels to your brain. Diabetes or even increased sugars put you at 300% increased risk of heart attack and stroke and this is why.. The sugar scrapes down your large blood vessels and your  body sees this as an internal injury and tries to repair itself. Just like you get a scab on your skin, your platelets will stick to the blood vessel wall trying to heal it. This is why we have diabetics on low dose aspirin daily, this prevents the platelets from sticking and can prevent plaque formation. In addition, your body takes cholesterol and tries to shove it into the open wound. This is why we want your LDL, or bad cholesterol, below 70.   The combination of platelets and cholesterol over 5-10 years forms plaque that can break off and cause a heart attack or stroke.   PLEASE REMEMBER:   Diabetes is preventable! Up to 70 percent of complications and morbidities among individuals with type 2 diabetes can be prevented, delayed, or effectively treated and minimized with regular visits to a health professional, appropriate monitoring and medication, and a healthy diet and lifestyle.  Here is some information to help you keep your heart healthy: Move it! - Aim for 30 mins of activity every day. Take it slowly at first. Talk to [place] before starting any new exercise program.   Lose it.  -Body Mass Index (BMI) can indicate if you need to lose weight. A healthy range is 18.5-24.9. For a BMI calculator, go to [employer].com  Waist Management -Excess abdominal fat is a risk factor for heart disease, diabetes, asthma, stroke and more. Ideal waist circumference is less than 35" for women and less than 40" for men.  Eat Right -focus on fruits, vegetables, whole grains, and meals you make yourself. Avoid foods with trans fat and high sugar/sodium content.   Snooze or Snore? - Loud snoring can be a sign of sleep apnea, a significant risk factor for high blood pressure, heart attach, stroke, and heart arrhythmias.  Kick the habit -Quit Smoking! Avoid second hand smoke. A single cigarette raises your blood pressure for 20 mins and increases the risk of heart attack and stroke for the next 24 hours.    Are Aspirin and Supplements right for you? -Add ENTERIC COATED low dose 81 mg Aspirin daily OR can do every other day if you have easy bruising to protect your heart and head. As well as to reduce risk of Colon Cancer by 20 %, Skin Cancer by 26 % , Melanoma by 46% and Pancreatic cancer by 60%  Say "No to Stress -There may be little you can do about problems that cause stress. However, techniques such as long walks, meditation, and exercise can help you manage it.   Start Now! - Make changes one at a time and set reasonable goals to increase your likelihood of success.          If you have lab work done today you will be contacted with your lab results within the next 2 weeks.  If you have not heard from [place] then please contact us. The fastest way to get your results is to register for My Chart.  Diabetes Mellitus and Standards of Medical Care Managing diabetes (diabetes mellitus) can be complicated. Your diabetes treatment may be managed by a team of health care providers, including:  A diet and nutrition specialist (registered dietitian).  A nurse.  A certified diabetes educator (CDE).  A diabetes specialist (endocrinologist).  An eye doctor.  A primary care provider.  A dentist.  Your health care providers follow a schedule in order to help you get the best quality of care. The following schedule is a general guideline for your diabetes management plan. Your health care providers may also give you more specific instructions. HbA1c ( hemoglobin A1c) test This test provides information about blood sugar (glucose) control over the previous 2-3 months. It is used to check whether your diabetes management plan needs to be adjusted.  If you are meeting your treatment goals, this test is done at least 2 times a year.  If you are not meeting treatment goals or if your treatment goals have changed, this test is done 4 times a year.  Blood pressure test  This test is done at  every routine medical visit. For most people, the goal is less than 130/80. Ask your health care provider what your goal blood pressure should be. Dental and eye exams  Visit your dentist two times a year.  If you have type 1 diabetes, get an eye exam 3-5 years after you are diagnosed, and then once a year after your first exam. ? If you were diagnosed with type 1 diabetes as a child, get an eye exam when you are age 90 or older and have had diabetes for 3-5 years. After the first exam, you should get an eye exam once a year.  If you have type 2 diabetes, have an eye exam as soon as you are diagnosed, and then once a year after your first exam. Foot care exam  Visual foot exams are done at every routine medical visit. The exams check for cuts, bruises, redness, blisters, sores, or other  problems with the feet.  A complete foot exam is done by your health care provider once a year. This exam includes an inspection of the structure and skin of your feet, and a check of the pulses and sensation in your feet. ? Type 1 diabetes: Get your first exam 3-5 years after diagnosis. ? Type 2 diabetes: Get your first exam as soon as you are diagnosed.  Check your feet every day for cuts, bruises, redness, blisters, or sores. If you have any of these or other problems that are not healing, contact your health care provider. Kidney function test ( urine microalbumin)  This test is done once a year. ? Type 1 diabetes: Get your first test 5 years after diagnosis. ? Type 2 diabetes: Get your first test as soon as you are diagnosed.  If you have chronic kidney disease (CKD), get a serum creatinine and estimated glomerular filtration rate (eGFR) test once a year. Lipid profile (cholesterol, HDL, LDL, triglycerides)  This test should be done when you are diagnosed with diabetes, and every 5 years after the first test. If you are on medicines to lower your cholesterol, you may need to get this test done every  year. ? The goal for LDL is less than 100 mg/dL (5.5 mmol/L). If you are at high risk, the goal is less than 70 mg/dL (3.9 mmol/L). ? The goal for HDL is 40 mg/dL (2.2 mmol/L) for men and 50 mg/dL(2.8 mmol/L) for women. An HDL cholesterol of 60 mg/dL (3.3 mmol/L) or higher gives some protection against heart disease. ? The goal for triglycerides is less than 150 mg/dL (8.3 mmol/L). Immunizations  The yearly flu (influenza) vaccine is recommended for everyone 6 months or older who has diabetes.  The pneumonia (pneumococcal) vaccine is recommended for everyone 2 years or older who has diabetes. If you are 40 or older, you may get the pneumonia vaccine as a series of two separate shots.  The hepatitis B vaccine is recommended for adults shortly after they have been diagnosed with diabetes.  The Tdap (tetanus, diphtheria, and pertussis) vaccine should be given: ? According to normal childhood vaccination schedules, for children. ? Every 10 years, for adults who have diabetes.  The shingles vaccine is recommended for people who have had chicken pox and are 50 years or older. Mental and emotional health  Screening for symptoms of eating disorders, anxiety, and depression is recommended at the time of diagnosis and afterward as needed. If your screening shows that you have symptoms (you have a positive screening result), you may need further evaluation and be referred to a mental health care provider. Diabetes self-management education  Education about how to manage your diabetes is recommended at diagnosis and ongoing as needed. Treatment plan  Your treatment plan will be reviewed at every medical visit. Summary  Managing diabetes (diabetes mellitus) can be complicated. Your diabetes treatment may be managed by a team of health care providers.  Your health care providers follow a schedule in order to help you get the best quality of care.  Standards of care including having regular physical  exams, blood tests, blood pressure monitoring, immunizations, screening tests, and education about how to manage your diabetes.  Your health care providers may also give you more specific instructions based on your individual health. This information is not intended to replace advice given to you by your health care provider. Make sure you discuss any questions you have with your health care provider. Document Released: 06/26/2009  Document Revised: 05/27/2016 Document Reviewed: 05/27/2016 Elsevier Interactive Patient Education  2018 [employer].   Diabetes Mellitus and Nutrition When you have diabetes (diabetes mellitus), it is very important to have healthy eating habits because your blood sugar (glucose) levels are greatly affected by what you eat and drink. Eating healthy foods in the appropriate amounts, at about the same times every day, can help you:  Control your blood glucose.  Lower your risk of heart disease.  Improve your blood pressure.  Reach or maintain a healthy weight.  Every person with diabetes is different, and each person has different needs for a meal plan. Your health care provider may recommend that you work with a diet and nutrition specialist (dietitian) to make a meal plan that is best for you. Your meal plan may vary depending on factors such as:  The calories you need.  The medicines you take.  Your weight.  Your blood glucose, blood pressure, and cholesterol levels.  Your activity level.  Other health conditions you have, such as heart or kidney disease.  How do carbohydrates affect me? Carbohydrates affect your blood glucose level more than any other type of food. Eating carbohydrates naturally increases the amount of glucose in your blood. Carbohydrate counting is a method for keeping track of how many carbohydrates you eat. Counting carbohydrates is important to keep your blood glucose at a healthy level, especially if you use insulin or take  certain oral diabetes medicines. It is important to know how many carbohydrates you can safely have in each meal. This is different for every person. Your dietitian can help you calculate how many carbohydrates you should have at each meal and for snack. Foods that contain carbohydrates include:  Bread, cereal, rice, pasta, and crackers.  Potatoes and corn.  Peas, beans, and lentils.  Milk and yogurt.  Fruit and juice.  Desserts, such as cakes, cookies, ice cream, and candy.  How does alcohol affect me? Alcohol can cause a sudden decrease in blood glucose (hypoglycemia), especially if you use insulin or take certain oral diabetes medicines. Hypoglycemia can be a life-threatening condition. Symptoms of hypoglycemia (sleepiness, dizziness, and confusion) are similar to symptoms of having too much alcohol. If your health care provider says that alcohol is safe for you, follow these guidelines:  Limit alcohol intake to no more than 1 drink per day for nonpregnant women and 2 drinks per day for men. One drink equals 12 oz of beer, 5 oz of wine, or 1 oz of hard liquor.  Do not drink on an empty stomach.  Keep yourself hydrated with water, diet soda, or unsweetened iced tea.  Keep in mind that regular soda, juice, and other mixers may contain a lot of sugar and must be counted as carbohydrates.  What are tips for following this plan? Reading food labels  Start by checking the serving size on the label. The amount of calories, carbohydrates, fats, and other nutrients listed on the label are based on one serving of the food. Many foods contain more than one serving per package.  Check the total grams (g) of carbohydrates in one serving. You can calculate the number of servings of carbohydrates in one serving by dividing the total carbohydrates by 15. For example, if a food has 30 g of total carbohydrates, it would be equal to 2 servings of carbohydrates.  Check the number of grams (g) of  saturated and trans fats in one serving. Choose foods that have low or no amount  of these fats.  Check the number of milligrams (mg) of sodium in one serving. Most people should limit total sodium intake to less than 2,300 mg per day.  Always check the nutrition information of foods labeled as "low-fat" or "nonfat". These foods may be higher in added sugar or refined carbohydrates and should be avoided.  Talk to your dietitian to identify your daily goals for nutrients listed on the label. Shopping  Avoid buying canned, premade, or processed foods. These foods tend to be high in fat, sodium, and added sugar.  Shop around the outside edge of the grocery store. This includes fresh fruits and vegetables, bulk grains, fresh meats, and fresh dairy. Cooking  Use low-heat cooking methods, such as baking, instead of high-heat cooking methods like deep frying.  Cook using healthy oils, such as olive, canola, or sunflower oil.  Avoid cooking with butter, cream, or high-fat meats. Meal planning  Eat meals and snacks regularly, preferably at the same times every day. Avoid going long periods of time without eating.  Eat foods high in fiber, such as fresh fruits, vegetables, beans, and whole grains. Talk to your dietitian about how many servings of carbohydrates you can eat at each meal.  Eat 4-6 ounces of lean protein each day, such as lean meat, chicken, fish, eggs, or tofu. 1 ounce is equal to 1 ounce of meat, chicken, or fish, 1 egg, or 1/4 cup of tofu.  Eat some foods each day that contain healthy fats, such as avocado, nuts, seeds, and fish. Lifestyle   Check your blood glucose regularly.  Exercise at least 30 minutes 5 or more days each week, or as told by your health care provider.  Take medicines as told by your health care provider.  Do not use any products that contain nicotine or tobacco, such as cigarettes and e-cigarettes. If you need help quitting, ask your health care  provider.  Work with a Social worker or diabetes educator to identify strategies to manage stress and any emotional and social challenges. What are some questions to ask my health care provider?  Do I need to meet with a diabetes educator?  Do I need to meet with a dietitian?  What number can I call if I have questions?  When are the best times to check my blood glucose? Where to find more information:  American Diabetes Association: diabetes.org/food-and-fitness/food  Academy of Nutrition and Dietetics: PokerClues.dk  [employer] of Diabetes and Digestive and Kidney Diseases (NIH): ContactWire.be Summary  A healthy meal plan will help you control your blood glucose and maintain a healthy lifestyle.  Working with a diet and nutrition specialist (dietitian) can help you make a meal plan that is best for you.  Keep in mind that carbohydrates and alcohol have immediate effects on your blood glucose levels. It is important to count carbohydrates and to use alcohol carefully. This information is not intended to replace advice given to you by your health care provider. Make sure you discuss any questions you have with your health care provider. Document Released: 05/26/2005 Document Revised: 10/03/2016 Document Reviewed: 10/03/2016 Elsevier Interactive Patient Education  2018 [employer].   Carbohydrate Counting for Diabetes Mellitus, Adult Carbohydrate counting is a method for keeping track of how many carbohydrates you eat. Eating carbohydrates naturally increases the amount of sugar (glucose) in the blood. Counting how many carbohydrates you eat helps keep your blood glucose within normal limits, which helps you manage your diabetes (diabetes mellitus). It is important to know  how many carbohydrates you can safely have in each meal. This is different for every  person. A diet and nutrition specialist (registered dietitian) can help you make a meal plan and calculate how many carbohydrates you should have at each meal and snack. Carbohydrates are found in the following foods:  Grains, such as breads and cereals.  Dried beans and soy products.  Starchy vegetables, such as potatoes, peas, and corn.  Fruit and fruit juices.  Milk and yogurt.  Sweets and snack foods, such as cake, cookies, candy, chips, and soft drinks.  How do I count carbohydrates? There are two ways to count carbohydrates in food. You can use either of the methods or a combination of both. Reading "Nutrition Facts" on packaged food The "Nutrition Facts" list is included on the labels of almost all packaged foods and beverages in the [place] It includes:  The serving size.  Information about nutrients in each serving, including the grams (g) of carbohydrate per serving.  To use the "Nutrition Facts":  Decide how many servings you will have.  Multiply the number of servings by the number of carbohydrates per serving.  The resulting number is the total amount of carbohydrates that you will be having.  Learning standard serving sizes of other foods When you eat foods containing carbohydrates that are not packaged or do not include "Nutrition Facts" on the label, you need to measure the servings in order to count the amount of carbohydrates:  Measure the foods that you will eat with a food scale or measuring cup, if needed.  Decide how many standard-size servings you will eat.  Multiply the number of servings by 15. Most carbohydrate-rich foods have about 15 g of carbohydrates per serving. ? For example, if you eat 8 oz (170 g) of strawberries, you will have eaten 2 servings and 30 g of carbohydrates (2 servings x 15 g = 30 g).  For foods that have more than one food mixed, such as soups and casseroles, you must count the carbohydrates in each food that is included.  The  following list contains standard serving sizes of common carbohydrate-rich foods. Each of these servings has about 15 g of carbohydrates:   hamburger bun or  English muffin.   oz (15 mL) syrup.   oz (14 g) jelly.  1 slice of bread.  1 six-inch tortilla.  3 oz (85 g) cooked rice or pasta.  4 oz (113 g) cooked dried beans.  4 oz (113 g) starchy vegetable, such as peas, corn, or potatoes.  4 oz (113 g) hot cereal.  4 oz (113 g) mashed potatoes or  of a large baked potato.  4 oz (113 g) canned or frozen fruit.  4 oz (120 mL) fruit juice.  4-6 crackers.  6 chicken nuggets.  6 oz (170 g) unsweetened dry cereal.  6 oz (170 g) plain fat-free yogurt or yogurt sweetened with artificial sweeteners.  8 oz (240 mL) milk.  8 oz (170 g) fresh fruit or one small piece of fruit.  24 oz (680 g) popped popcorn.  Example of carbohydrate counting Sample meal  3 oz (85 g) chicken breast.  6 oz (170 g) brown rice.  4 oz (113 g) corn.  8 oz (240 mL) milk.  8 oz (170 g) strawberries with sugar-free whipped topping. Carbohydrate calculation 1. Identify the foods that contain carbohydrates: ? Rice. ? Corn. ? Milk. ? Strawberries. 2. Calculate how many servings you have of each food: ? 2  servings rice. ? 1 serving corn. ? 1 serving milk. ? 1 serving strawberries. 3. Multiply each number of servings by 15 g: ? 2 servings rice x 15 g = 30 g. ? 1 serving corn x 15 g = 15 g. ? 1 serving milk x 15 g = 15 g. ? 1 serving strawberries x 15 g = 15 g. 4. Add together all of the amounts to find the total grams of carbohydrates eaten: ? 30 g + 15 g + 15 g + 15 g = 75 g of carbohydrates total. This information is not intended to replace advice given to you by your health care provider. Make sure you discuss any questions you have with your health care provider. Document Released: 08/29/2005 Document Revised: 03/18/2016 Document Reviewed: 02/10/2016 Elsevier Interactive Patient  Education  2018 [employer].    IF you received an x-ray today, you will receive an invoice from [facility]. Please contact [facility] at [phone] with questions or concerns regarding your invoice.   IF you received labwork today, you will receive an invoice from [facility]. Please contact [facility] at [phone] with questions or concerns regarding your invoice.   Our billing staff will not be able to assist you with questions regarding bills from these companies.  You will be contacted with the lab results as soon as they are available. The fastest way to get your results is to activate your My Chart account. Instructions are located on the last page of this paperwork. If you have not heard from [place] regarding the results in 2 weeks, please contact this office.

## 2018-05-31 ENCOUNTER — Encounter: Payer: Self-pay | Admitting: *Deleted

## 2018-05-31 LAB — CMP14+EGFR
A/G RATIO: 1.6 (ref 1.2–2.2)
ALK PHOS: 105 IU/L (ref 39–117)
ALT: 9 IU/L (ref 0–32)
AST: 15 IU/L (ref 0–40)
Albumin: 4.2 g/dL (ref 3.6–4.8)
BUN/Creatinine Ratio: 19 (ref 12–28)
BUN: 17 mg/dL (ref 8–27)
Bilirubin Total: 0.5 mg/dL (ref 0.0–1.2)
CHLORIDE: 102 mmol/L (ref 96–106)
CO2: 22 mmol/L (ref 20–29)
Calcium: 9 mg/dL (ref 8.7–10.3)
Creatinine, Ser: 0.9 mg/dL (ref 0.57–1.00)
GFR calc Af Amer: 80 mL/min/{1.73_m2} (ref >59)
GFR calc non Af Amer: 69 mL/min/{1.73_m2} (ref >59)
GLUCOSE: 144 mg/dL — AB (ref 65–99)
Globulin, Total: 2.6 g/dL (ref 1.5–4.5)
POTASSIUM: 4 mmol/L (ref 3.5–5.2)
Sodium: 141 mmol/L (ref 134–144)
TOTAL PROTEIN: 6.8 g/dL (ref 6.0–8.5)

## 2018-06-16 ENCOUNTER — Other Ambulatory Visit: Payer: Self-pay | Admitting: Physician Assistant

## 2018-06-16 DIAGNOSIS — N3941 Urge incontinence: Secondary | ICD-10-CM

## 2018-06-16 NOTE — Telephone Encounter (Signed)
Copied from [place] [phone]. Topic: General - Other >> Jun 15, 2018  3:43 PM Leward Quan A wrote: Reason for CRM: Patient called to request diabetic testing supplies One Touch Ultra meter, Test strips, Lancets and everything that goes with this kit stated that she is testing twice daily. Request a  call back with the decision made.  Ph# [phone]

## 2018-06-18 NOTE — Telephone Encounter (Signed)
Requested medication (s) are due for refill today: yes  Requested medication (s) are on the active medication list: yes  Last refill:  03/26/18  Future visit scheduled: no  Notes to clinic:  See attached request for diabetic supplies.    Requested Prescriptions  Pending Prescriptions Disp Refills   oxybutynin (DITROPAN-XL) 10 MG 24 hr tablet [Pharmacy Med Name: OXYBUTYNIN CL ER 10 MG TABLET] 90 tablet 0    Sig: TAKE 1 TABLET BY MOUTH EVERYDAY AT BEDTIME     Urology:  Bladder Agents Passed - 06/16/2018 12:50 PM      Passed - Valid encounter within last 12 months    Recent Outpatient Visits          2 weeks ago Uncontrolled type 2 diabetes mellitus with hyperglycemia, without long-term current use of insulin (HCC)   [facility] [place], [place] D, PA-C   5 months ago Acute non-recurrent maxillary sinusitis   [facility], [name], MD   6 months ago Annual physical exam   [facility], [place] D, PA-C   10 months ago Acute bilateral low back pain without sciatica   [facility] [place], [place] D, PA-C   1 year ago Adhesive capsulitis of left shoulder   [facility] at [facility], Manus Rudd, MD

## 2018-06-19 NOTE — Telephone Encounter (Signed)
Patient is requesting a refill of the following medications: Requested Prescriptions   Pending Prescriptions Disp Refills  . oxybutynin (DITROPAN-XL) 10 MG 24 hr tablet [Pharmacy Med Name: OXYBUTYNIN CL ER 10 MG TABLET] 90 tablet 0    Sig: TAKE 1 TABLET BY MOUTH EVERYDAY AT BEDTIME    Date of patient request: 06/15/18 Last office visit: 05/30/18 Date of last refill: 03/26/2018 Last refill amount: 90tab Follow up time period per chart:

## 2018-09-17 LAB — HM DIABETES EYE EXAM

## 2018-09-19 ENCOUNTER — Other Ambulatory Visit: Payer: Self-pay | Admitting: Physician Assistant

## 2018-09-19 DIAGNOSIS — N3941 Urge incontinence: Secondary | ICD-10-CM

## 2018-09-19 NOTE — Telephone Encounter (Signed)
Requested medication (s) are due for refill today: yes  Requested medication (s) are on the active medication list: yes  Last refill:  06/19/18  Future visit scheduled: no  Notes to clinic:  Called patient to request she make an appointment to establish with new PCP.  Pt of B. Barnett Abu PA    Requested Prescriptions  Pending Prescriptions Disp Refills   oxybutynin (DITROPAN-XL) 10 MG 24 hr tablet [Pharmacy Med Name: OXYBUTYNIN CL ER 10 MG TABLET] 90 tablet 0    Sig: TAKE 1 TABLET BY MOUTH EVERYDAY AT BEDTIME     Urology:  Bladder Agents Passed - 09/19/2018 11:30 AM      Passed - Valid encounter within last 12 months    Recent Outpatient Visits          3 months ago Uncontrolled type 2 diabetes mellitus with hyperglycemia, without long-term current use of insulin [facility])   Primary Care at [place], [place] D, PA-C   9 months ago Acute non-recurrent maxillary sinusitis   [facility] at [facility], [name], MD   9 months ago Annual physical exam   Primary Care at [place], [place] D, PA-C   1 year ago Acute bilateral low back pain without sciatica   Primary Care at [place], [place] D, PA-C   1 year ago Adhesive capsulitis of left shoulder   Primary Care at [facility], Manus Rudd, MD

## 2018-09-19 NOTE — Telephone Encounter (Signed)
Call placed to patient. Left VM to call for an appointment to establish care with new provider.

## 2018-10-15 ENCOUNTER — Other Ambulatory Visit: Payer: Self-pay | Admitting: Physician Assistant

## 2018-10-15 DIAGNOSIS — N3941 Urge incontinence: Secondary | ICD-10-CM

## 2018-10-15 NOTE — Telephone Encounter (Signed)
Requested medication (s) are due for refill today: yes  Requested medication (s) are on the active medication list: yes  Last refill:  09/22/2018 for 30 tabs  Future visit scheduled: yes  Notes to clinic:  Pt of Dr. Clelia Croft.   Requested Prescriptions  Pending Prescriptions Disp Refills   oxybutynin (DITROPAN-XL) 10 MG 24 hr tablet [Pharmacy Med Name: OXYBUTYNIN CL ER 10 MG TABLET] 30 tablet 0    Sig: TAKE 1 TABLET BY MOUTH EVERYDAY AT BEDTIME     Urology:  Bladder Agents Passed - 10/15/2018  2:06 AM      Passed - Valid encounter within last 12 months    Recent Outpatient Visits          4 months ago Uncontrolled type 2 diabetes mellitus with hyperglycemia, without long-term current use of insulin [facility])   Primary Care at [place], [place] D, PA-C   9 months ago Acute non-recurrent maxillary sinusitis   [facility] at [facility], [name], MD   10 months ago Annual physical exam   Primary Care at [place], [place] D, PA-C   1 year ago Acute bilateral low back pain without sciatica   Primary Care at [facility], [place] D, PA-C   1 year ago Adhesive capsulitis of left shoulder   [facility] at [facility], Manus Rudd, MD      Future Appointments            In 1 week Sherren Mocha, MD Primary Care at [place], [facility]

## 2018-10-16 ENCOUNTER — Telehealth: Payer: Self-pay | Admitting: Family Medicine

## 2018-10-16 NOTE — Telephone Encounter (Signed)
Left a VM in regards to her appt on 10/25/2018 with Dr. Clelia Croft. The provider will be out of the office and will need to be rescheduled.

## 2018-10-22 ENCOUNTER — Ambulatory Visit: Payer: Self-pay

## 2018-10-22 ENCOUNTER — Other Ambulatory Visit: Payer: Self-pay

## 2018-10-22 ENCOUNTER — Ambulatory Visit: Payer: BC Managed Care – PPO | Admitting: Family Medicine

## 2018-10-22 ENCOUNTER — Encounter: Payer: Self-pay | Admitting: Family Medicine

## 2018-10-22 VITALS — BP 164/94 | HR 98 | Temp 98.6°F | Ht 66.81 in | Wt 223.0 lb

## 2018-10-22 DIAGNOSIS — E1165 Type 2 diabetes mellitus with hyperglycemia: Secondary | ICD-10-CM

## 2018-10-22 DIAGNOSIS — N3941 Urge incontinence: Secondary | ICD-10-CM | POA: Diagnosis not present

## 2018-10-22 DIAGNOSIS — Z1211 Encounter for screening for malignant neoplasm of colon: Secondary | ICD-10-CM

## 2018-10-22 DIAGNOSIS — R030 Elevated blood-pressure reading, without diagnosis of hypertension: Secondary | ICD-10-CM

## 2018-10-22 LAB — POCT GLYCOSYLATED HEMOGLOBIN (HGB A1C): Hemoglobin A1C: 8.2 % — AB (ref 4.0–5.6)

## 2018-10-22 MED ORDER — OXYBUTYNIN CHLORIDE ER 10 MG PO TB24: ORAL_TABLET | ORAL | 1 refills | Status: DC

## 2018-10-22 MED ORDER — SITAGLIPTIN PHOSPHATE 100 MG PO TABS: 100.0000 mg | ORAL_TABLET | Freq: Every day | ORAL | 3 refills | Status: DC

## 2018-10-22 NOTE — Telephone Encounter (Signed)
Pt. Reports she checked her BP this weekend and was getting high readings on and off - 168/99, 164/90 for example. Does not have headache or dizziness today. Does not take high BP medications. Would also like to get refills on her medications.  Reason for Disposition . Systolic BP  >= 180 OR Diastolic >= 110  Answer Assessment - Initial Assessment Questions 1. BLOOD PRESSURE: "What is the blood pressure?" "Did you take at least two measurements 5 minutes apart?"     Over the weekend - 168/99  164/90 2. ONSET: "When did you take your blood pressure?"     This weekend 3. HOW: "How did you obtain the blood pressure?" (e.g., visiting nurse, automatic home BP monitor)     Home monitor 4. HISTORY: "Do you have a history of high blood pressure?"     No 5. MEDICATIONS: "Are you taking any medications for blood pressure?" "Have you missed any doses recently?"     No meds 6. OTHER SYMPTOMS: "Do you have any symptoms?" (e.g., headache, chest pain, blurred vision, difficulty breathing, weakness)     No 7. PREGNANCY: "Is there any chance you are pregnant?" "When was your last menstrual period?"     No  Protocols used: HIGH BLOOD PRESSURE-A-AH

## 2018-10-22 NOTE — Patient Instructions (Addendum)
Return in 2 weeks for BP check with the nurse. Please request that the nurse send me a telephone message with your BP reading. Goal is BP 140/90 or less. If still high then I will send BP medication.   If you have lab work done today you will be contacted with your lab results within the next 2 weeks.  If you have not heard from [place] then please contact us. The fastest way to get your results is to register for My Chart.   IF you received an x-ray today, you will receive an invoice from [facility]. Please contact [facility] at [phone] with questions or concerns regarding your invoice.   IF you received labwork today, you will receive an invoice from [facility]. Please contact [facility] at [phone] with questions or concerns regarding your invoice.   Our billing staff will not be able to assist you with questions regarding bills from these companies.  You will be contacted with the lab results as soon as they are available. The fastest way to get your results is to activate your My Chart account. Instructions are located on the last page of this paperwork. If you have not heard from [place] regarding the results in 2 weeks, please contact this office.     DASH Eating Plan DASH stands for "Dietary Approaches to Stop Hypertension." The DASH eating plan is a healthy eating plan that has been shown to reduce high blood pressure (hypertension). It may also reduce your risk for type 2 diabetes, heart disease, and stroke. The DASH eating plan may also help with weight loss. What are tips for following this plan?  General guidelines  Avoid eating more than 2,300 mg (milligrams) of salt (sodium) a day. If you have hypertension, you may need to reduce your sodium intake to 1,500 mg a day.  Limit alcohol intake to no more than 1 drink a day for nonpregnant women and 2 drinks a day for men. One drink equals 12 oz of beer, 5 oz of wine, or 1 oz of hard liquor.  Work with your health  care provider to maintain a healthy body weight or to lose weight. Ask what an ideal weight is for you.  Get at least 30 minutes of exercise that causes your heart to beat faster (aerobic exercise) most days of the week. Activities may include walking, swimming, or biking.  Work with your health care provider or diet and nutrition specialist (dietitian) to adjust your eating plan to your individual calorie needs. Reading food labels   Check food labels for the amount of sodium per serving. Choose foods with less than 5 percent of the Daily Value of sodium. Generally, foods with less than 300 mg of sodium per serving fit into this eating plan.  To find whole grains, look for the word "whole" as the first word in the ingredient list. Shopping  Buy products labeled as "low-sodium" or "no salt added."  Buy fresh foods. Avoid canned foods and premade or frozen meals. Cooking  Avoid adding salt when cooking. Use salt-free seasonings or herbs instead of table salt or sea salt. Check with your health care provider or pharmacist before using salt substitutes.  Do not fry foods. Cook foods using healthy methods such as baking, boiling, grilling, and broiling instead.  Cook with heart-healthy oils, such as olive, canola, soybean, or sunflower oil. Meal planning  Eat a balanced diet that includes: ? 5 or more servings of fruits and vegetables each day. At each  meal, try to fill half of your plate with fruits and vegetables. ? Up to 6-8 servings of whole grains each day. ? Less than 6 oz of lean meat, poultry, or fish each day. A 3-oz serving of meat is about the same size as a deck of cards. One egg equals 1 oz. ? 2 servings of low-fat dairy each day. ? A serving of nuts, seeds, or beans 5 times each week. ? Heart-healthy fats. Healthy fats called Omega-3 fatty acids are found in foods such as flaxseeds and coldwater fish, like sardines, salmon, and mackerel.  Limit how much you eat of the  following: ? Canned or prepackaged foods. ? Food that is high in trans fat, such as fried foods. ? Food that is high in saturated fat, such as fatty meat. ? Sweets, desserts, sugary drinks, and other foods with added sugar. ? Full-fat dairy products.  Do not salt foods before eating.  Try to eat at least 2 vegetarian meals each week.  Eat more home-cooked food and less restaurant, buffet, and fast food.  When eating at a restaurant, ask that your food be prepared with less salt or no salt, if possible. What foods are recommended? The items listed may not be a complete list. Talk with your dietitian about what dietary choices are best for you. Grains Whole-grain or whole-wheat bread. Whole-grain or whole-wheat pasta. Brown rice. Or[place] Cobbatmeal. Quinoa. Bulgur. Whole-grain and low-sodium cereals. Pita bread. Low-fat, low-sodium crackers. Whole-wheat flour tortillas. Vegetables Fresh or frozen vegetables (raw, steamed, roasted, or grilled). Low-sodium or reduced-sodium tomato and vegetable juice. Low-sodium or reduced-sodium tomato sauce and tomato paste. Low-sodium or reduced-sodium canned vegetables. Fruits All fresh, dried, or frozen fruit. Canned fruit in natural juice (without added sugar). Meat and other protein foods Skinless chicken or Malawiturkey. Ground chicken or Mal[place]urkey. Pork with fat trimmed off. Fish and seafood. Egg whites. Dried beans, peas, or lentils. Unsalted nuts, nut butters, and seeds. Unsalted canned beans. Lean cuts of beef with fat trimmed off. Low-sodium, lean deli meat. Dairy Low-fat (1%) or fat-free (skim) milk. Fat-free, low-fat, or reduced-fat cheeses. Nonfat, low-sodium ricotta or cottage cheese. Low-fat or nonfat yogurt. Low-fat, low-sodium cheese. Fats and oils Soft margarine without trans fats. Vegetable oil. Low-fat, reduced-fat, or light mayonnaise and salad dressings (reduced-sodium). Canola, safflower, olive, soybean, and sunflower oils. Avocado. Seasoning and other  foods Herbs. Spices. Seasoning mixes without salt. Unsalted popcorn and pretzels. Fat-free sweets. What foods are not recommended? The items listed may not be a complete list. Talk with your dietitian about what dietary choices are best for you. Grains Baked goods made with fat, such as croissants, muffins, or some breads. Dry pasta or rice meal packs. Vegetables Creamed or fried vegetables. Vegetables in a cheese sauce. Regular canned vegetables (not low-sodium or reduced-sodium). Regular canned tomato sauce and paste (not low-sodium or reduced-sodium). Regular tomato and vegetable juice (not low-sodium or reduced-sodium). Rosita FirePickles. Olives. Fruits Canned fruit in a light or heavy syrup. Fried fruit. Fruit in cream or butter sauce. Meat and other protein foods Fatty cuts of meat. Ribs. Fried meat. Tomasa BlaseBacon. Sausage. Bologna and other processed lunch meats. Salami. Fatback. Hotdogs. Bratwurst. Salted nuts and seeds. Canned beans with added salt. Canned or smoked fish. Whole eggs or egg yolks. Chicken or Malaw[place]urkey with skin. Dairy Whole or 2% milk, cream, and half-and-half. Whole or full-fat cream cheese. Whole-fat or sweetened yogurt. Full-fat cheese. Nondairy creamers. Whipped toppings. Processed cheese and cheese spreads. Fats and oils Butter. Stick margarine. Lard. Shortening.  [place] Musty fat. Tropical oils, such as coconut, palm kernel, or palm oil. Seasoning and other foods Salted popcorn and pretzels. Onion salt, garlic salt, seasoned salt, table salt, and sea salt. Worcestershire sauce. Tartar sauce. Barbecue sauce. Teriyaki sauce. Soy sauce, including reduced-sodium. Steak sauce. Canned and packaged gravies. Fish sauce. Oyster sauce. Cocktail sauce. Horseradish that you find on the shelf. Ketchup. Mustard. Meat flavorings and tenderizers. Bouillon cubes. Hot sauce and Tabasco sauce. Premade or packaged marinades. Premade or packaged taco seasonings. Relishes. Regular salad dressings. Where to find  more information:  National Heart, Lung, and Blood Institute: PopSteam.is  American Heart Association: www.heart.org Summary  The DASH eating plan is a healthy eating plan that has been shown to reduce high blood pressure (hypertension). It may also reduce your risk for type 2 diabetes, heart disease, and stroke.  With the DASH eating plan, you should limit salt (sodium) intake to 2,300 mg a day. If you have hypertension, you may need to reduce your sodium intake to 1,500 mg a day.  When on the DASH eating plan, aim to eat more fresh fruits and vegetables, whole grains, lean proteins, low-fat dairy, and heart-healthy fats.  Work with your health care provider or diet and nutrition specialist (dietitian) to adjust your eating plan to your individual calorie needs. This information is not intended to replace advice given to you by your health care provider. Make sure you discuss any questions you have with your health care provider. Document Released: 08/18/2011 Document Revised: 08/22/2016 Document Reviewed: 08/22/2016 Elsevier Interactive Patient Education  2019 [employer].

## 2018-10-22 NOTE — Progress Notes (Signed)
2/10/20205:03 PM  Anna Johns 1956-06-01, 63 y.o. female [id number]  Chief Complaint  Patient presents with  . Diabetes    follow up with diabetes, feeling lightheaded and dizzy with the new medication. thinks oit may be spiking the bp, has been monitoring the bp, numbers have been unusually high for her    HPI:   Patient is a 63 y.o. female with past medical history significant for DM2 who presents today for routine followup  Last OV sept 2019 with Timmothy Euler, PA-C Metformin increased to '2000mg'$  day However she did not tolerate GI effects She has remained on metformin '500mg'$  once a day and is still having significant diarrhea each time she eats, becomes a challenge at work  Patient reports that her recent increase in BP happened about 3-4 days ago Has been eating more bacon and bbq than usual She checks her BP at home every day Similar reading to today Having stressors at home Normally BP normal  Needs refill for oxybutynin for her bladder Covers her during most of work hours, works well for her   Lab Results  Component Value Date   HGBA1C 8.2 (A) 10/22/2018   HGBA1C 7.8 (A) 05/30/2018   HGBA1C 8.0 (H) 12/09/2017   Lab Results  Component Value Date   LDLCALC 121 (H) 12/09/2017   CREATININE 0.90 05/30/2018    Fall Risk  10/22/2018 05/30/2018 12/22/2017 12/09/2017 08/15/2017  Falls in the past year? 0 Yes No No Yes  Number falls in past yr: 0 1 - - 1  Injury with Fall? 0 Yes - - Yes  Comment - - - - -     Depression screen [facility] 2/9 10/22/2018 05/30/2018 12/22/2017  Decreased Interest 0 0 0  Down, Depressed, Hopeless 0 0 0  PHQ - 2 Score 0 0 0    Allergies  Allergen Reactions  . Fish Allergy     Muscles and Bluefish    Prior to Admission medications   Medication Sig Start Date End Date Taking? Authorizing Provider  metFORMIN (GLUCOPHAGE-XR) 500 MG 24 hr tablet Take 4 tablets (2,000 mg total) by mouth every evening. TAKE 1 TABLET BY MOUTH EVERY DAY WITH  BREAKFAST 05/30/18  Yes Timmothy Euler, [place] D, PA-C  oxybutynin (DITROPAN-XL) 10 MG 24 hr tablet TAKE 1 TABLET BY MOUTH EVERYDAY AT BEDTIME 09/22/18  Yes McVey, Gelene Mink, PA-C    Past Medical History:  Diagnosis Date  . Allergy   . Anemia   . DM2 (diabetes mellitus, type 2) ([place])   . Urinary incontinence     Past Surgical History:  Procedure Laterality Date  . ABDOMINAL HYSTERECTOMY     fibroids; ovaries intact.  . ACHILLES TENDON SURGERY Right 03/02/2017   Procedure: ACHILLES TENDON REPAIR, RIGHT;  Surgeon: Ninetta Lights, MD;  Location: [place];  Service: Orthopedics;  Laterality: Right;  . BONE EXCISION Right 03/02/2017   Procedure: EXCISION PARTIAL BONE TALUS/CALCANEUS RIGHT FOOT;  Surgeon: Ninetta Lights, MD;  Location: [place];  Service: Orthopedics;  Laterality: Right;  . CHOLECYSTECTOMY    . CHOLECYSTECTOMY    . KNEE SURGERY      Social History   Tobacco Use  . Smoking status: Never Smoker  . Smokeless tobacco: Never Used  Substance Use Topics  . Alcohol use: No    Family History  Problem Relation Age of Onset  . COPD Mother   . Heart disease Mother        CHF  .  Multiple myeloma Father   . Cancer Father        bone  . Lupus Brother     Review of Systems  Constitutional: Negative for chills and fever.  Respiratory: Negative for cough and shortness of breath.   Cardiovascular: Negative for chest pain, palpitations and leg swelling.  Gastrointestinal: Negative for abdominal pain, nausea and vomiting.  per hpi   OBJECTIVE:  Blood pressure (!) 164/94, pulse 98, temperature 98.6 F (37 C), temperature source Oral, height 5' 6.81" (1.697 m), weight 223 lb (101.2 kg), SpO2 98 %. Body mass index is 35.13 kg/m.   Wt Readings from Last 3 Encounters:  10/22/18 223 lb (101.2 kg)  05/30/18 216 lb (98 kg)  12/22/17 218 lb 12.8 oz (99.2 kg)   BP Readings from Last 3 Encounters:  10/22/18 (!) 164/94  05/30/18 133/78   12/22/17 (!) 150/78    Physical Exam Vitals signs and nursing note reviewed.  Constitutional:      Appearance: She is well-developed.  HENT:     Head: Normocephalic and atraumatic.     Mouth/Throat:     Pharynx: No oropharyngeal exudate.  Eyes:     General: No scleral icterus.    Conjunctiva/sclera: Conjunctivae normal.     Pupils: Pupils are equal, round, and reactive to light.  Neck:     Musculoskeletal: Neck supple.  Cardiovascular:     Rate and Rhythm: Normal rate and regular rhythm.     Heart sounds: Normal heart sounds. No murmur. No friction rub. No gallop.   Pulmonary:     Effort: Pulmonary effort is normal.     Breath sounds: Normal breath sounds. No wheezing or rales.  Skin:    General: Skin is warm and dry.  Neurological:     Mental Status: She is alert and oriented to person, place, and time.     Results for orders placed or performed in visit on 10/22/18 (from the past 24 hour(s))  POCT glycosylated hemoglobin (Hb A1C)     Status: Abnormal   Collection Time: 10/22/18  5:01 PM  Result Value Ref Range   Hemoglobin A1C 8.2 (A) 4.0 - 5.6 %   HbA1c POC (<> result, manual entry)     HbA1c, POC (prediabetic range)     HbA1c, POC (controlled diabetic range)      ASSESSMENT and PLAN  1. Uncontrolled type 2 diabetes mellitus with hyperglycemia, without long-term current use of insulin (HCC) Uncontrolled. Dc metformin as not tolerating. Start [place], reviewed r/se/b. Discussed LFM.  - POCT glycosylated hemoglobin (Hb A1C) - Lipid panel - TSH - CMP14+EGFR  2. Elevated BP without diagnosis of hypertension No h/o HTN. Having sign stressors and high salt diet. Patient not interested in starting medication at this time. Discussed LFM. Cont monitoring at home. Recheck 2 weeks with nurse visit, if still elevated then will start amlodipine  - Care order/instruction:  3. Urge urinary incontinence Controlled. Continue current regime.  - oxybutynin (DITROPAN-XL) 10 MG  24 hr tablet; TAKE 1 TABLET BY MOUTH EVERYDAY AT BEDTIME  4. Special screening for malignant neoplasms, colon - Cologuard  Other orders - sitaGLIPtin (JANUVIA) 100 MG tablet; Take 1 tablet (100 mg total) by mouth daily.   Return in about 3 months (around 01/20/2019).    Rutherford Guys, MD Primary Care at [place], [place] [address] Ph.  [phone] Fax [phone]

## 2018-10-23 LAB — CMP14+EGFR
ALT: 16 IU/L (ref 0–32)
AST: 14 IU/L (ref 0–40)
Albumin/Globulin Ratio: 1.8 (ref 1.2–2.2)
Albumin: 4.6 g/dL (ref 3.8–4.8)
Alkaline Phosphatase: 113 IU/L (ref 39–117)
BUN/Creatinine Ratio: 20 (ref 12–28)
BUN: 19 mg/dL (ref 8–27)
Bilirubin Total: 0.5 mg/dL (ref 0.0–1.2)
CO2: 21 mmol/L (ref 20–29)
Calcium: 9.6 mg/dL (ref 8.7–10.3)
Chloride: 101 mmol/L (ref 96–106)
Creatinine, Ser: 0.97 mg/dL (ref 0.57–1.00)
GFR calc Af Amer: 73 mL/min/{1.73_m2} (ref >59)
GFR calc non Af Amer: 63 mL/min/{1.73_m2} (ref >59)
Globulin, Total: 2.5 g/dL (ref 1.5–4.5)
Glucose: 142 mg/dL — ABNORMAL HIGH (ref 65–99)
Potassium: 4.4 mmol/L (ref 3.5–5.2)
Sodium: 140 mmol/L (ref 134–144)
Total Protein: 7.1 g/dL (ref 6.0–8.5)

## 2018-10-23 LAB — LIPID PANEL
Chol/HDL Ratio: 3.7 ratio (ref 0.0–4.4)
Cholesterol, Total: 175 mg/dL (ref 100–199)
HDL: 47 mg/dL (ref >39)
LDL Calculated: 97 mg/dL (ref 0–99)
Triglycerides: 155 mg/dL — ABNORMAL HIGH (ref 0–149)
VLDL Cholesterol Cal: 31 mg/dL (ref 5–40)

## 2018-10-23 LAB — TSH: TSH: 1.45 u[IU]/mL (ref 0.450–4.500)

## 2018-10-25 ENCOUNTER — Ambulatory Visit: Payer: BC Managed Care – PPO | Admitting: Family Medicine

## 2018-11-07 ENCOUNTER — Encounter: Payer: Self-pay | Admitting: Family Medicine

## 2018-11-07 ENCOUNTER — Ambulatory Visit: Payer: BC Managed Care – PPO | Admitting: Family Medicine

## 2018-11-07 VITALS — BP 142/80 | HR 92 | Temp 98.3°F | Resp 17 | Ht 66.0 in | Wt 223.0 lb

## 2018-11-07 DIAGNOSIS — R030 Elevated blood-pressure reading, without diagnosis of hypertension: Secondary | ICD-10-CM | POA: Diagnosis not present

## 2018-11-07 NOTE — Progress Notes (Signed)
2/26/20204:30 PM  Anna Johns March 09, 1956, 63 y.o. female [id number]  Chief Complaint  Patient presents with  . Hypertension    follow up for bp    HPI:   Patient is a 63 y.o. female with past medical history significant for DM2 who presents today for BP check  Last OV had elevated BP and started [place] She was eating more salt, having stressors She was going to work LFM and monitor BP  She is feeling well today [place] well  She has cut back on salt significantly She is planning on starting to walk now that its getting lighter and warmer   Fall Risk  11/07/2018 10/22/2018 05/30/2018 12/22/2017 12/09/2017  Falls in the past year? 0 0 Yes No No  Number falls in past yr: - 0 1 - -  Injury with Fall? - 0 Yes - -  Comment - - - - -     Depression screen [facility] 2/9 11/07/2018 10/22/2018 05/30/2018  Decreased Interest 0 0 0  Down, Depressed, Hopeless 0 0 0  PHQ - 2 Score 0 0 0  Altered sleeping 0 - -  Tired, decreased energy 0 - -  Change in appetite 0 - -  Feeling bad or failure about yourself  0 - -  Trouble concentrating 0 - -  Moving slowly or fidgety/restless 0 - -  Suicidal thoughts 0 - -  PHQ-9 Score 0 - -  Difficult doing work/chores Not difficult at all - -    Allergies  Allergen Reactions  . Fish Allergy     Muscles and Bluefish    Prior to Admission medications   Medication Sig Start Date End Date Taking? Authorizing Provider  oxybutynin (DITROPAN-XL) 10 MG 24 hr tablet TAKE 1 TABLET BY MOUTH EVERYDAY AT BEDTIME 10/22/18  Yes Rutherford Guys, MD  sitaGLIPtin (JANUVIA) 100 MG tablet Take 1 tablet (100 mg total) by mouth daily. 10/22/18  Yes Rutherford Guys, MD    Past Medical History:  Diagnosis Date  . Allergy   . Anemia   . DM2 (diabetes mellitus, type 2) ([place])   . Urinary incontinence     Past Surgical History:  Procedure Laterality Date  . ABDOMINAL HYSTERECTOMY     fibroids; ovaries intact.  . ACHILLES TENDON SURGERY Right  03/02/2017   Procedure: ACHILLES TENDON REPAIR, RIGHT;  Surgeon: Ninetta Lights, MD;  Location: [place];  Service: Orthopedics;  Laterality: Right;  . BONE EXCISION Right 03/02/2017   Procedure: EXCISION PARTIAL BONE TALUS/CALCANEUS RIGHT FOOT;  Surgeon: Ninetta Lights, MD;  Location: [place];  Service: Orthopedics;  Laterality: Right;  . CHOLECYSTECTOMY    . CHOLECYSTECTOMY    . KNEE SURGERY      Social History   Tobacco Use  . Smoking status: Never Smoker  . Smokeless tobacco: Never Used  Substance Use Topics  . Alcohol use: No    Family History  Problem Relation Age of Onset  . COPD Mother   . Heart disease Mother        CHF  . Multiple myeloma Father   . Cancer Father        bone  . Lupus Brother     ROS Per hpi  OBJECTIVE: Blood pressure (!) 142/80, pulse 92, temperature 98.3 F (36.8 C), temperature source Oral, resp. rate 17, height '5\' 6"'$  (1.676 m), weight 223 lb (101.2 kg), SpO2 98 %. Body mass index is 35.99 kg/m.  Physical Exam Vitals signs and nursing note reviewed.  Constitutional:      Appearance: She is well-developed.  HENT:     Head: Normocephalic and atraumatic.  Eyes:     General: No scleral icterus.    Conjunctiva/sclera: Conjunctivae normal.     Pupils: Pupils are equal, round, and reactive to light.  Neck:     Musculoskeletal: Neck supple.  Pulmonary:     Effort: Pulmonary effort is normal.  Skin:    General: Skin is warm and dry.  Neurological:     Mental Status: She is alert and oriented to person, place, and time.    ASSESSMENT and PLAN  1. Elevated BP without diagnosis of hypertension Improving. Continue LFM. Recheck at next visit  Return for as scheduled.    Rutherford Guys, MD Primary Care at [place],  [address] Ph.  [phone] Fax [phone]

## 2018-11-07 NOTE — Patient Instructions (Signed)
° ° ° °  If you have lab work done today you will be contacted with your lab results within the next 2 weeks.  If you have not heard from us then please contact us. The fastest way to get your results is to register for My Chart. ° ° °IF you received an x-ray today, you will receive an invoice from [facility]. Please contact [facility] at [phone] with questions or concerns regarding your invoice.  ° °IF you received labwork today, you will receive an invoice from [facility]. Please contact [facility] at [phone] with questions or concerns regarding your invoice.  ° °Our billing staff will not be able to assist you with questions regarding bills from these companies. ° °You will be contacted with the lab results as soon as they are available. The fastest way to get your results is to activate your My Chart account. Instructions are located on the last page of this paperwork. If you have not heard from us regarding the results in 2 weeks, please contact this office. °  ° ° ° °

## 2019-01-21 ENCOUNTER — Telehealth: Payer: BC Managed Care – PPO | Admitting: Family Medicine

## 2019-01-21 ENCOUNTER — Ambulatory Visit: Payer: BC Managed Care – PPO | Admitting: Family Medicine

## 2019-01-21 ENCOUNTER — Other Ambulatory Visit: Payer: Self-pay

## 2019-01-23 NOTE — Progress Notes (Signed)
Lab orders only 

## 2019-01-26 ENCOUNTER — Other Ambulatory Visit: Payer: Self-pay | Admitting: Family Medicine

## 2019-01-26 NOTE — Telephone Encounter (Signed)
Requested Prescriptions  Pending Prescriptions Disp Refills  . JANUVIA 100 MG tablet [Pharmacy Med Name: JANUVIA 100 MG TABLET] 90 tablet 1    Sig: TAKE 1 TABLET BY MOUTH EVERY DAY     Endocrinology:  Diabetes - DPP-4 Inhibitors Failed - 01/26/2019 12:21 PM      Failed - HBA1C is between 0 and 7.9 and within 180 days    Hemoglobin A1C  Date Value Ref Range Status  10/22/2018 8.2 (A) 4.0 - 5.6 % Final   Hgb A1c MFr Bld  Date Value Ref Range Status  12/09/2017 8.0 (H) 4.8 - 5.6 % Final    Comment:             Prediabetes: 5.7 - 6.4          Diabetes: >6.4          Glycemic control for adults with diabetes: <7.0          Passed - Cr in normal range and within 360 days    Creat  Date Value Ref Range Status  04/22/2016 0.83 0.50 - 1.05 mg/dL Final    Comment:      For patients > or = 62 years of age: The upper reference limit for Creatinine is approximately 13% higher for people identified as African-American.      Creatinine, Ser  Date Value Ref Range Status  10/22/2018 0.97 0.57 - 1.00 mg/dL Final         Passed - Valid encounter within last 6 months    Recent Outpatient Visits          2 months ago Elevated BP without diagnosis of hypertension   [facility], [name], MD   3 months ago Uncontrolled type 2 diabetes mellitus with hyperglycemia, without long-term current use of insulin [facility])   [facility], [name], MD   8 months ago Uncontrolled type 2 diabetes mellitus with hyperglycemia, without long-term current use of insulin [facility])   [facility] at [place], [place] D, PA-C   1 year ago Acute non-recurrent maxillary sinusitis   [facility], [name], MD   1 year ago Annual physical exam   Primary Care at [facility], [place] D, [place]

## 2019-05-25 ENCOUNTER — Other Ambulatory Visit: Payer: Self-pay | Admitting: Family Medicine

## 2019-05-25 DIAGNOSIS — N3941 Urge incontinence: Secondary | ICD-10-CM

## 2019-05-25 NOTE — Telephone Encounter (Signed)
Requested medication (s) are due for refill today: yes  Requested medication (s) are on the active medication list: yes  Last refill:  02/08/2019  Future visit scheduled: no  Notes to clinic:  Review for refill   Requested Prescriptions  Pending Prescriptions Disp Refills   oxybutynin (DITROPAN-XL) 10 MG 24 hr tablet [Pharmacy Med Name: OXYBUTYNIN CL ER 10 MG TABLET] 90 tablet 1    Sig: TAKE 1 TABLET BY MOUTH EVERYDAY AT BEDTIME     Urology:  Bladder Agents Passed - 05/25/2019 11:05 AM      Passed - Valid encounter within last 12 months    Recent Outpatient Visits          4 months ago Uncontrolled type 2 diabetes mellitus with hyperglycemia, without long-term current use of insulin ([facility])   [facility], Lilia Argue, MD   6 months ago Elevated BP without diagnosis of hypertension   [facility], Lilia Argue, MD   7 months ago Uncontrolled type 2 diabetes mellitus with hyperglycemia, without long-term current use of insulin [facility])   [facility], Lilia Argue, MD   12 months ago Uncontrolled type 2 diabetes mellitus with hyperglycemia, without long-term current use of insulin [facility])   Primary Care at [place], [place] D, PA-C   1 year ago Acute non-recurrent maxillary sinusitis   [facility], Lilia Argue, MD

## 2019-05-29 ENCOUNTER — Other Ambulatory Visit: Payer: Self-pay

## 2019-05-29 ENCOUNTER — Emergency Department (HOSPITAL_COMMUNITY): Payer: BC Managed Care – PPO

## 2019-05-29 ENCOUNTER — Encounter (HOSPITAL_COMMUNITY): Payer: Self-pay | Admitting: Obstetrics and Gynecology

## 2019-05-29 ENCOUNTER — Emergency Department (HOSPITAL_COMMUNITY)
Admission: EM | Admit: 2019-05-29 | Discharge: 2019-05-29 | Disposition: A | Discharge status: Home / Self Care | Payer: BC Managed Care – PPO | Attending: Emergency Medicine | Admitting: Emergency Medicine

## 2019-05-29 DIAGNOSIS — Z7984 Long term (current) use of oral hypoglycemic drugs: Secondary | ICD-10-CM | POA: Insufficient documentation

## 2019-05-29 DIAGNOSIS — R079 Chest pain, unspecified: Secondary | ICD-10-CM | POA: Insufficient documentation

## 2019-05-29 DIAGNOSIS — Y998 Other external cause status: Secondary | ICD-10-CM | POA: Diagnosis not present

## 2019-05-29 DIAGNOSIS — E119 Type 2 diabetes mellitus without complications: Secondary | ICD-10-CM | POA: Diagnosis not present

## 2019-05-29 DIAGNOSIS — S0083XA Contusion of other part of head, initial encounter: Secondary | ICD-10-CM | POA: Diagnosis not present

## 2019-05-29 DIAGNOSIS — S0990XA Unspecified injury of head, initial encounter: Secondary | ICD-10-CM | POA: Diagnosis present

## 2019-05-29 DIAGNOSIS — Y92019 Unspecified place in single-family (private) house as the place of occurrence of the external cause: Secondary | ICD-10-CM | POA: Diagnosis not present

## 2019-05-29 DIAGNOSIS — Y9389 Activity, other specified: Secondary | ICD-10-CM | POA: Insufficient documentation

## 2019-05-29 MED ORDER — OXYCODONE-ACETAMINOPHEN 5-325 MG PO TABS
1.0000 | ORAL_TABLET | ORAL | Status: DC | PRN
  Administered 2019-05-29: 1 via ORAL
  Filled 2019-05-29: qty 1

## 2019-05-29 MED ORDER — METHOCARBAMOL 500 MG PO TABS: 1000.0000 mg | ORAL_TABLET | Freq: Four times a day (QID) | ORAL | 0 refills | Status: DC | PRN

## 2019-05-29 MED ORDER — IBUPROFEN 200 MG PO TABS
400.0000 mg | ORAL_TABLET | Freq: Once | ORAL | Status: AC
  Administered 2019-05-29: 400 mg via ORAL
  Filled 2019-05-29: qty 2
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
## 2019-05-29 NOTE — Discharge Instructions (Signed)

## 2019-07-12 ENCOUNTER — Encounter: Payer: Self-pay | Admitting: Family Medicine

## 2019-07-12 ENCOUNTER — Other Ambulatory Visit: Payer: Self-pay

## 2019-07-12 ENCOUNTER — Ambulatory Visit: Payer: BC Managed Care – PPO | Admitting: Family Medicine

## 2019-07-12 VITALS — BP 160/88 | HR 97 | Temp 98.6°F | Ht 66.0 in | Wt 221.8 lb

## 2019-07-12 DIAGNOSIS — R42 Dizziness and giddiness: Secondary | ICD-10-CM

## 2019-07-12 DIAGNOSIS — E1165 Type 2 diabetes mellitus with hyperglycemia: Secondary | ICD-10-CM | POA: Diagnosis not present

## 2019-07-12 DIAGNOSIS — R030 Elevated blood-pressure reading, without diagnosis of hypertension: Secondary | ICD-10-CM | POA: Diagnosis not present

## 2019-07-12 DIAGNOSIS — S060X0A Concussion without loss of consciousness, initial encounter: Secondary | ICD-10-CM | POA: Diagnosis not present

## 2019-07-12 DIAGNOSIS — M25561 Pain in right knee: Secondary | ICD-10-CM

## 2019-07-12 DIAGNOSIS — R29818 Other symptoms and signs involving the nervous system: Secondary | ICD-10-CM

## 2019-07-12 LAB — POCT URINALYSIS DIP (MANUAL ENTRY)
Bilirubin, UA: NEGATIVE
Blood, UA: NEGATIVE
Glucose, UA: NEGATIVE mg/dL
Ketones, POC UA: NEGATIVE mg/dL
Leukocytes, UA: NEGATIVE
Nitrite, UA: NEGATIVE
Protein Ur, POC: NEGATIVE mg/dL
Spec Grav, UA: 1.02 (ref 1.010–1.025)
Urobilinogen, UA: 0.2 E.U./dL
pH, UA: 5.5 (ref 5.0–8.0)

## 2019-07-12 MED ORDER — AMLODIPINE BESYLATE 5 MG PO TABS: 5.0000 mg | ORAL_TABLET | Freq: Every day | ORAL | 0 refills | Status: DC
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
## 2019-07-12 NOTE — Patient Instructions (Addendum)
   Please make appt with your eye doctor  If you have lab work done today you will be contacted with your lab results within the next 2 weeks.  If you have not heard from [place] then please contact us. The fastest way to get your results is to register for My Chart.   IF you received an x-ray today, you will receive an invoice from [facility]. Please contact [facility] at [phone] with questions or concerns regarding your invoice.   IF you received labwork today, you will receive an invoice from [place]. Please contact [facility] at [phone] with questions or concerns regarding your invoice.   Our billing staff will not be able to assist you with questions regarding bills from these companies.  You will be contacted with the lab results as soon as they are available. The fastest way to get your results is to activate your My Chart account. Instructions are located on the last page of this paperwork. If you have not heard from [place] regarding the results in 2 weeks, please contact this office.

## 2019-07-13 LAB — MICROALBUMIN / CREATININE URINE RATIO
Creatinine, Urine: 187.1 mg/dL
Microalb/Creat Ratio: 11 mg/g creat (ref 0–29)
Microalbumin, Urine: 21.5 ug/mL

## 2019-07-13 LAB — COMPREHENSIVE METABOLIC PANEL
ALT: 7 IU/L (ref 0–32)
AST: 15 IU/L (ref 0–40)
Albumin/Globulin Ratio: 1.6 (ref 1.2–2.2)
Albumin: 4.4 g/dL (ref 3.8–4.8)
Alkaline Phosphatase: 111 IU/L (ref 39–117)
BUN/Creatinine Ratio: 14 (ref 12–28)
BUN: 13 mg/dL (ref 8–27)
Bilirubin Total: 0.6 mg/dL (ref 0.0–1.2)
CO2: 22 mmol/L (ref 20–29)
Calcium: 9.4 mg/dL (ref 8.7–10.3)
Chloride: 100 mmol/L (ref 96–106)
Creatinine, Ser: 0.9 mg/dL (ref 0.57–1.00)
GFR calc Af Amer: 79 mL/min/{1.73_m2} (ref >59)
GFR calc non Af Amer: 69 mL/min/{1.73_m2} (ref >59)
Globulin, Total: 2.7 g/dL (ref 1.5–4.5)
Glucose: 165 mg/dL — ABNORMAL HIGH (ref 65–99)
Potassium: 4.3 mmol/L (ref 3.5–5.2)
Sodium: 137 mmol/L (ref 134–144)
Total Protein: 7.1 g/dL (ref 6.0–8.5)

## 2019-07-13 LAB — CBC
Hematocrit: 35.9 % (ref 34.0–46.6)
Hemoglobin: 11.8 g/dL (ref 11.1–15.9)
MCH: 29 pg (ref 26.6–33.0)
MCHC: 32.9 g/dL (ref 31.5–35.7)
MCV: 88 fL (ref 79–97)
Platelets: 245 10*3/uL (ref 150–450)
RBC: 4.07 x10E6/uL (ref 3.77–5.28)
RDW: 13.2 % (ref 11.7–15.4)
WBC: 4.9 10*3/uL (ref 3.4–10.8)

## 2019-07-13 LAB — LIPID PANEL
Chol/HDL Ratio: 3.8 ratio (ref 0.0–4.4)
Cholesterol, Total: 180 mg/dL (ref 100–199)
HDL: 47 mg/dL (ref >39)
LDL Chol Calc (NIH): 116 mg/dL — ABNORMAL HIGH (ref 0–99)
Triglycerides: 95 mg/dL (ref 0–149)
VLDL Cholesterol Cal: 17 mg/dL (ref 5–40)

## 2019-07-13 LAB — TSH: TSH: 1.41 u[IU]/mL (ref 0.450–4.500)

## 2019-07-13 LAB — HEMOGLOBIN A1C
Est. average glucose Bld gHb Est-mCnc: 200 mg/dL
Hgb A1c MFr Bld: 8.6 % — ABNORMAL HIGH (ref 4.8–5.6)

## 2019-07-13 LAB — HM DIABETES EYE EXAM

## 2019-07-15 ENCOUNTER — Other Ambulatory Visit: Payer: Self-pay | Admitting: Family Medicine

## 2019-07-15 MED ORDER — METFORMIN HCL 500 MG PO TABS: 500.0000 mg | ORAL_TABLET | Freq: Two times a day (BID) | ORAL | 1 refills | Status: DC

## 2019-07-19 ENCOUNTER — Ambulatory Visit
Admission: RE | Admit: 2019-07-19 | Discharge: 2019-07-19 | Disposition: A | Discharge status: Home / Self Care | Payer: BC Managed Care – PPO | Source: Ambulatory Visit | Attending: Family Medicine | Admitting: Family Medicine

## 2019-07-19 DIAGNOSIS — R42 Dizziness and giddiness: Secondary | ICD-10-CM

## 2019-07-19 DIAGNOSIS — R29818 Other symptoms and signs involving the nervous system: Secondary | ICD-10-CM

## 2019-07-19 DIAGNOSIS — S060X0A Concussion without loss of consciousness, initial encounter: Secondary | ICD-10-CM

## 2019-07-21 ENCOUNTER — Other Ambulatory Visit: Payer: Self-pay | Admitting: Family Medicine

## 2019-08-19 ENCOUNTER — Ambulatory Visit: Payer: Self-pay | Admitting: *Deleted

## 2019-08-19 ENCOUNTER — Telehealth: Payer: Self-pay | Admitting: Family Medicine

## 2019-08-19 NOTE — Telephone Encounter (Signed)
Please see message and below. Pt is asking for a letter to be sent to his employer about how her medical condition/ issues. Pt wants to work but only remotely and she wants a letter saying that it is a medically necessary for her to work form home  Vs coming into the office. Pt is a Pharmacist, hospital.
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
## 2019-08-19 NOTE — Telephone Encounter (Signed)
Copied from [place] [id number]. Topic: General - Other >> Aug 19, 2019 10:16 AM Celene Kras wrote: Reason for CRM: Pt called stating she is needing to have a note for work stating that she is allowed to continue to work remotely as she has diabetes and BP issues. Please advise.

## 2019-08-20 NOTE — Telephone Encounter (Signed)
Please write letter stating she is to work remotely. She is to keep her appt with me on the 11th. thanks

## 2019-08-22 ENCOUNTER — Telehealth: Payer: Self-pay

## 2019-08-22 NOTE — Telephone Encounter (Signed)
Pt has letter pending giving her permission to work from home due to her chronic conditions. She will be completing the set up of mychart today to retrieve letter

## 2019-08-22 NOTE — Telephone Encounter (Signed)
Pt is setting up mychart to be able to retrieve letter. Letter is now pending

## 2019-08-23 ENCOUNTER — Ambulatory Visit: Payer: BC Managed Care – PPO | Admitting: Family Medicine

## 2019-08-23 ENCOUNTER — Encounter: Payer: Self-pay | Admitting: Family Medicine

## 2019-08-23 ENCOUNTER — Other Ambulatory Visit: Payer: Self-pay

## 2019-08-23 VITALS — BP 130/80 | HR 92 | Temp 98.6°F | Ht 66.0 in | Wt 224.6 lb

## 2019-08-23 DIAGNOSIS — F430 Acute stress reaction: Secondary | ICD-10-CM

## 2019-08-23 DIAGNOSIS — S060X0A Concussion without loss of consciousness, initial encounter: Secondary | ICD-10-CM

## 2019-08-23 DIAGNOSIS — N3941 Urge incontinence: Secondary | ICD-10-CM | POA: Diagnosis not present

## 2019-08-23 DIAGNOSIS — E1165 Type 2 diabetes mellitus with hyperglycemia: Secondary | ICD-10-CM | POA: Diagnosis not present

## 2019-08-23 DIAGNOSIS — I10 Essential (primary) hypertension: Secondary | ICD-10-CM | POA: Diagnosis not present

## 2019-08-23 DIAGNOSIS — M26621 Arthralgia of right temporomandibular joint: Secondary | ICD-10-CM

## 2019-08-23 MED ORDER — AMLODIPINE BESYLATE 5 MG PO TABS: 5.0000 mg | ORAL_TABLET | Freq: Every day | ORAL | 0 refills | Status: DC

## 2019-08-23 MED ORDER — OXYBUTYNIN CHLORIDE ER 10 MG PO TB24: 10.0000 mg | ORAL_TABLET | Freq: Every day | ORAL | 0 refills | Status: DC

## 2019-08-23 MED ORDER — CYCLOBENZAPRINE HCL 10 MG PO TABS: 10.0000 mg | ORAL_TABLET | Freq: Three times a day (TID) | ORAL | 0 refills | Status: DC | PRN

## 2019-08-23 NOTE — Patient Instructions (Addendum)
If you have lab work done today you will be contacted with your lab results within the next 2 weeks.  If you have not heard from [place] then please contact us. The fastest way to get your results is to register for My Chart.   IF you received an x-ray today, you will receive an invoice from [facility]. Please contact [facility] at [phone] with questions or concerns regarding your invoice.   IF you received labwork today, you will receive an invoice from [place]. Please contact [facility] at [phone] with questions or concerns regarding your invoice.   Our billing staff will not be able to assist you with questions regarding bills from these companies.  You will be contacted with the lab results as soon as they are available. The fastest way to get your results is to activate your My Chart account. Instructions are located on the last page of this paperwork. If you have not heard from [place] regarding the results in 2 weeks, please contact this office.     Temporomandibular Joint Syndrome  Temporomandibular joint syndrome (TMJ syndrome) is a condition that causes pain in the temporomandibular joints. These joints are located near your ears and allow your jaw to open and close. For people with TMJ syndrome, chewing, biting, or other movements of the jaw can be difficult or painful. TMJ syndrome is often mild and goes away within a few weeks. However, sometimes the condition becomes a long-term (chronic) problem. What are the causes? This condition may be caused by:  Grinding your teeth or clenching your jaw. Some people do this when they are under stress.  Arthritis.  Injury to the jaw.  Head or neck injury.  Teeth or dentures that are not aligned well. In some cases, the cause of TMJ syndrome may not be known. What are the signs or symptoms? The most common symptom of this condition is an aching pain on the side of the head in the area of the TMJ. Other  symptoms may include:  Pain when moving your jaw, such as when chewing or biting.  Being unable to open your jaw all the way.  Making a clicking sound when you open your mouth.  Headache.  Earache.  Neck or shoulder pain. How is this diagnosed? This condition may be diagnosed based on:  Your symptoms and medical history.  A physical exam. Your health care provider may check the range of motion of your jaw.  Imaging tests, such as X-rays or an MRI. You may also need to see your dentist, who will determine if your teeth and jaw are lined up correctly. How is this treated? TMJ syndrome often goes away on its own. If treatment is needed, the options may include:  Eating soft foods and applying ice or heat.  Medicines to relieve pain or inflammation.  Medicines or massage to relax the muscles.  A splint, bite plate, or mouthpiece to prevent teeth grinding or jaw clenching.  Relaxation techniques or counseling to help reduce stress.  A therapy for pain in which an electrical current is applied to the nerves through the skin (transcutaneous electrical nerve stimulation).  Acupuncture. This is sometimes helpful to relieve pain.  Jaw surgery. This is rarely needed. Follow these instructions at home:  Eating and drinking  Eat a soft diet if you are having trouble chewing.  Avoid foods that require a lot of chewing. Do not chew gum. General instructions  Take over-the-counter and prescription medicines only  as told by your health care provider.  If directed, put ice on the painful area. ? Put ice in a plastic bag. ? Place a towel between your skin and the bag. ? Leave the ice on for 20 minutes, 2-3 times a day.  Apply a warm, wet cloth (warm compress) to the painful area as directed.  Massage your jaw area and do any jaw stretching exercises as told by your health care provider.  If you were given a splint, bite plate, or mouthpiece, wear it as told by your health  care provider.  Keep all follow-up visits as told by your health care provider. This is important. Contact a health care provider if:  You are having trouble eating.  You have new or worsening symptoms. Get help right away if:  Your jaw locks open or closed. Summary  Temporomandibular joint syndrome (TMJ syndrome) is a condition that causes pain in the temporomandibular joints. These joints are located near your ears and allow your jaw to open and close.  TMJ syndrome is often mild and goes away within a few weeks. However, sometimes the condition becomes a long-term (chronic) problem.  Symptoms include an aching pain on the side of the head in the area of the TMJ, pain when chewing or biting, and being unable to open your jaw all the way. You may also make a clicking sound when you open your mouth.  TMJ syndrome often goes away on its own. If treatment is needed, it may include medicines to relieve pain, reduce inflammation, or relax the muscles. A splint, bite plate, or mouthpiece may also be used to prevent teeth grinding or jaw clenching. This information is not intended to replace advice given to you by your health care provider. Make sure you discuss any questions you have with your health care provider. Document Released: 05/24/2001 Document Revised: 11/10/2017 Document Reviewed: 10/10/2017 Elsevier Patient Education  2020 [employer].

## 2019-08-23 NOTE — Telephone Encounter (Signed)
Please advise on this. Pt is requesting some information on his FMLA papers. I advised him that I would have someone F/U with him regarding this on 08/26/2019. Pk/CMA
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
## 2019-08-25 NOTE — Telephone Encounter (Signed)
Please look into this. Thanks.

## 2019-08-27 NOTE — Telephone Encounter (Signed)
Please Advise on this. Pk/CMA 

## 2019-09-04 LAB — HM MAMMOGRAPHY

## 2019-09-10 ENCOUNTER — Encounter: Payer: Self-pay | Admitting: Obstetrics

## 2019-09-10 ENCOUNTER — Encounter: Payer: Self-pay | Admitting: Family Medicine

## 2019-09-18 NOTE — Telephone Encounter (Signed)
Letter was created for patient on 08/22/2019 by Moises Blood, Dr Adela Glimpse assistant.

## 2019-11-05 ENCOUNTER — Other Ambulatory Visit: Payer: Self-pay | Admitting: Family Medicine

## 2019-11-05 DIAGNOSIS — N3941 Urge incontinence: Secondary | ICD-10-CM

## 2019-11-05 NOTE — Telephone Encounter (Signed)
Requested Prescriptions  Pending Prescriptions Disp Refills  . oxybutynin (DITROPAN-XL) 10 MG 24 hr tablet [Pharmacy Med Name: OXYBUTYNIN CL ER 10 MG TABLET] 90 tablet 0    Sig: TAKE 1 TABLET BY MOUTH EVERYDAY AT BEDTIME. NEED OFFICE VISIT     Urology:  Bladder Agents Passed - 11/05/2019  1:40 PM      Passed - Valid encounter within last 12 months    Recent Outpatient Visits          2 months ago Uncontrolled type 2 diabetes mellitus with hyperglycemia, without long-term current use of insulin ([facility])   [facility], [name], MD   3 months ago Dizziness   [facility], [name], MD   9 months ago Uncontrolled type 2 diabetes mellitus with hyperglycemia, without long-term current use of insulin [facility])   [facility], [name], MD   12 months ago Elevated BP without diagnosis of hypertension   [facility], [name], MD   1 year ago Uncontrolled type 2 diabetes mellitus with hyperglycemia, without long-term current use of insulin [facility])   [facility], [name], MD      Future Appointments            In 2 weeks Myles Lipps, MD Primary Care at [facility], [facility]           . oxybutynin (DITROPAN-XL) 10 MG 24 hr tablet [Pharmacy Med Name: OXYBUTYNIN CL ER 10 MG TABLET] 90 tablet 0    Sig: TAKE 1 TABLET BY MOUTH EVERYDAY AT BEDTIME     Urology:  Bladder Agents Passed - 11/05/2019  1:40 PM      Passed - Valid encounter within last 12 months    Recent Outpatient Visits          2 months ago Uncontrolled type 2 diabetes mellitus with hyperglycemia, without long-term current use of insulin (HCC)   [facility], [name], MD   3 months ago Dizziness   [facility], [name], MD   9 months ago Uncontrolled type 2 diabetes mellitus with hyperglycemia, without long-term current use of insulin [facility])   [facility], [name], MD   12 months ago Elevated BP  without diagnosis of hypertension   [facility], [name], MD   1 year ago Uncontrolled type 2 diabetes mellitus with hyperglycemia, without long-term current use of insulin [facility])   [facility], [name], MD      Future Appointments            In 2 weeks Myles Lipps, MD Primary Care at [place], [facility]

## 2019-11-13 ENCOUNTER — Other Ambulatory Visit: Payer: Self-pay | Admitting: Family Medicine

## 2019-11-22 ENCOUNTER — Ambulatory Visit: Payer: BC Managed Care – PPO | Admitting: Family Medicine

## 2019-12-05 ENCOUNTER — Ambulatory Visit: Payer: BC Managed Care – PPO | Admitting: Family Medicine

## 2019-12-12 ENCOUNTER — Encounter: Payer: Self-pay | Admitting: Family Medicine

## 2019-12-12 ENCOUNTER — Other Ambulatory Visit: Payer: Self-pay

## 2019-12-12 ENCOUNTER — Ambulatory Visit: Payer: BC Managed Care – PPO | Admitting: Family Medicine

## 2019-12-12 VITALS — BP 130/80 | HR 97 | Temp 97.9°F | Ht 66.0 in | Wt 220.0 lb

## 2019-12-12 DIAGNOSIS — E1165 Type 2 diabetes mellitus with hyperglycemia: Secondary | ICD-10-CM | POA: Diagnosis not present

## 2019-12-12 DIAGNOSIS — I10 Essential (primary) hypertension: Secondary | ICD-10-CM | POA: Diagnosis not present

## 2019-12-12 DIAGNOSIS — N3941 Urge incontinence: Secondary | ICD-10-CM

## 2019-12-12 NOTE — Patient Instructions (Signed)
° ° ° °  If you have lab work done today you will be contacted with your lab results within the next 2 weeks.  If you have not heard from us then please contact us. The fastest way to get your results is to register for My Chart. ° ° °IF you received an x-ray today, you will receive an invoice from [facility]. Please contact [facility] at [phone] with questions or concerns regarding your invoice.  ° °IF you received labwork today, you will receive an invoice from [facility]. Please contact [facility] at [phone] with questions or concerns regarding your invoice.  ° °Our billing staff will not be able to assist you with questions regarding bills from these companies. ° °You will be contacted with the lab results as soon as they are available. The fastest way to get your results is to activate your My Chart account. Instructions are located on the last page of this paperwork. If you have not heard from us regarding the results in 2 weeks, please contact this office. °  ° ° ° °
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
## 2019-12-13 LAB — LIPID PANEL
Chol/HDL Ratio: 4.6 ratio — ABNORMAL HIGH (ref 0.0–4.4)
Cholesterol, Total: 198 mg/dL (ref 100–199)
HDL: 43 mg/dL (ref >39)
LDL Chol Calc (NIH): 133 mg/dL — ABNORMAL HIGH (ref 0–99)
Triglycerides: 123 mg/dL (ref 0–149)
VLDL Cholesterol Cal: 22 mg/dL (ref 5–40)

## 2019-12-13 LAB — COMPREHENSIVE METABOLIC PANEL
ALT: 9 IU/L (ref 0–32)
AST: 11 IU/L (ref 0–40)
Albumin/Globulin Ratio: 1.8 (ref 1.2–2.2)
Albumin: 4.5 g/dL (ref 3.8–4.8)
Alkaline Phosphatase: 124 IU/L — ABNORMAL HIGH (ref 39–117)
BUN/Creatinine Ratio: 17 (ref 12–28)
BUN: 19 mg/dL (ref 8–27)
Bilirubin Total: 0.5 mg/dL (ref 0.0–1.2)
CO2: 23 mmol/L (ref 20–29)
Calcium: 9.5 mg/dL (ref 8.7–10.3)
Chloride: 102 mmol/L (ref 96–106)
Creatinine, Ser: 1.12 mg/dL — ABNORMAL HIGH (ref 0.57–1.00)
GFR calc Af Amer: 61 mL/min/{1.73_m2} (ref >59)
GFR calc non Af Amer: 53 mL/min/{1.73_m2} — ABNORMAL LOW (ref >59)
Globulin, Total: 2.5 g/dL (ref 1.5–4.5)
Glucose: 251 mg/dL — ABNORMAL HIGH (ref 65–99)
Potassium: 4.4 mmol/L (ref 3.5–5.2)
Sodium: 140 mmol/L (ref 134–144)
Total Protein: 7 g/dL (ref 6.0–8.5)

## 2019-12-13 LAB — HEMOGLOBIN A1C
Est. average glucose Bld gHb Est-mCnc: 249 mg/dL
Hgb A1c MFr Bld: 10.3 % — ABNORMAL HIGH (ref 4.8–5.6)

## 2020-01-05 ENCOUNTER — Other Ambulatory Visit: Payer: Self-pay | Admitting: Family Medicine

## 2020-01-05 MED ORDER — FARXIGA 10 MG PO TABS: 10.0000 mg | ORAL_TABLET | Freq: Every day | ORAL | 3 refills | Status: DC

## 2020-01-05 MED ORDER — ATORVASTATIN CALCIUM 20 MG PO TABS: 20.0000 mg | ORAL_TABLET | Freq: Every day | ORAL | 3 refills | Status: DC

## 2020-01-31 ENCOUNTER — Other Ambulatory Visit: Payer: Self-pay | Admitting: Family Medicine

## 2020-01-31 DIAGNOSIS — N3941 Urge incontinence: Secondary | ICD-10-CM

## 2020-02-06 ENCOUNTER — Other Ambulatory Visit: Payer: Self-pay | Admitting: Family Medicine

## 2020-04-20 ENCOUNTER — Telehealth (INDEPENDENT_AMBULATORY_CARE_PROVIDER_SITE_OTHER): Payer: BC Managed Care – PPO | Admitting: Emergency Medicine

## 2020-04-20 ENCOUNTER — Encounter: Payer: Self-pay | Admitting: Emergency Medicine

## 2020-04-20 ENCOUNTER — Other Ambulatory Visit: Payer: Self-pay

## 2020-04-20 VITALS — Ht 67.0 in | Wt 218.0 lb

## 2020-04-20 DIAGNOSIS — R059 Cough, unspecified: Secondary | ICD-10-CM

## 2020-04-20 DIAGNOSIS — R05 Cough: Secondary | ICD-10-CM

## 2020-04-20 DIAGNOSIS — R0981 Nasal congestion: Secondary | ICD-10-CM | POA: Diagnosis not present

## 2020-04-20 DIAGNOSIS — R6889 Other general symptoms and signs: Secondary | ICD-10-CM

## 2020-04-20 DIAGNOSIS — J069 Acute upper respiratory infection, unspecified: Secondary | ICD-10-CM

## 2020-04-20 MED ORDER — BENZONATATE 200 MG PO CAPS: 200.0000 mg | ORAL_CAPSULE | Freq: Two times a day (BID) | ORAL | 0 refills | Status: DC | PRN

## 2020-04-20 MED ORDER — AZITHROMYCIN 250 MG PO TABS: ORAL_TABLET | ORAL | 0 refills | Status: DC

## 2020-04-20 MED ORDER — HYDROCODONE-HOMATROPINE 5-1.5 MG/5ML PO SYRP: 5.0000 mL | ORAL_SOLUTION | Freq: Every evening | ORAL | 0 refills | Status: DC | PRN

## 2020-04-20 NOTE — Patient Instructions (Signed)
° ° ° °  If you have lab work done today you will be contacted with your lab results within the next 2 weeks.  If you have not heard from us then please contact us. The fastest way to get your results is to register for My Chart. ° ° °IF you received an x-ray today, you will receive an invoice from [facility]. Please contact [facility] at [phone] with questions or concerns regarding your invoice.  ° °IF you received labwork today, you will receive an invoice from [facility]. Please contact [facility] at [phone] with questions or concerns regarding your invoice.  ° °Our billing staff will not be able to assist you with questions regarding bills from these companies. ° °You will be contacted with the lab results as soon as they are available. The fastest way to get your results is to activate your My Chart account. Instructions are located on the last page of this paperwork. If you have not heard from us regarding the results in 2 weeks, please contact this office. °  ° ° ° °

## 2020-04-20 NOTE — Progress Notes (Signed)
Telemedicine Encounter- SOAP NOTE Established Patient My chart video telephone conference Patient: Home  Provider: Office     This video telephone encounter was conducted with the patient's (or proxy's) verbal consent via video audio telecommunications: yes/no: Yes Patient was instructed to have this encounter in a suitably private space; and to only have persons present to whom they give permission to participate. In addition, patient identity was confirmed by use of name plus two identifiers (DOB and address).  I discussed the limitations, risks, security and privacy concerns of performing an evaluation and management service by telephone and the availability of in person appointments. I also discussed with the patient that there may be a patient responsible charge related to this service. The patient expressed understanding and agreed to proceed.  I spent a total of TIME; 0 MIN TO 60 MIN: 20 minutes talking with the patient or their proxy.  Chief Complaint  Patient presents with  . Cough    all sx started Friday morning. Tried OTC and has not helped.   . Sinusitis    sinus pain and pressure   . Headache  . Sore Throat    Subjective   Anna Johns is a 64 y.o. female diabetic established patient. Telephone visit today complaining of flulike symptoms that started 3 days ago with sinus pain and pressure, sore throat, headache, dry cough, chills.  Fully vaccinated against Covid.  History of diabetes.  Denies difficulty breathing.  Able to eat and drink.  Denies nausea or vomiting.  Denies abdominal pain or diarrhea.  Denies abnormal skin rash.  No other significant associated symptoms.  Scheduled to be tested for Covid tomorrow afternoon.  HPI   Patient Active Problem List   Diagnosis Date Noted  . Uncontrolled type 2 diabetes mellitus with hyperglycemia, without long-term current use of insulin (H[facility]) 09/15/2015    Past Medical History:  Diagnosis Date  . Allergy   .  Anemia   . DM2 (diabetes mellitus, type 2) (H[facility])   . Urinary incontinence     Current Outpatient Medications  Medication Sig Dispense Refill  . amLODipine (NORVASC) 5 MG tablet TAKE 1 TABLET BY MOUTH EVERY DAY 90 tablet 0  . atorvastatin (LIPITOR) 20 MG tablet Take 1 tablet (20 mg total) by mouth daily. 90 tablet 3  . dapagliflozin propanediol (FARXIGA) 10 MG TABS tablet Take 10 mg by mouth daily before breakfast. 30 tablet 3  . JANUVIA 100 MG tablet TAKE 1 TABLET BY MOUTH EVERY DAY 90 tablet 1  . oxybutynin (DITROPAN-XL) 10 MG 24 hr tablet TAKE 1 TABLET BY MOUTH EVERYDAY AT BEDTIME 90 tablet 0   No current facility-administered medications for this visit.    Allergies  Allergen Reactions  . Fish Allergy     Muscles and Bluefish  . Metformin And Related Diarrhea    Social History   Socioeconomic History  . Marital status: Divorced    Spouse name: Not on file  . Number of children: 2  . Years of education: Not on file  . Highest education level: Not on file  Occupational History  . Occupation: Runner, broadcasting/film/video  Tobacco Use  . Smoking status: Never Smoker  . Smokeless tobacco: Never Used  Vaping Use  . Vaping Use: Never used  Substance and Sexual Activity  . Alcohol use: No  . Drug use: Never  . Sexual activity: Not Currently    Partners: Male    Birth control/protection: Surgical  Other Topics Concern  . Not  on file  Social History Narrative   Divorced after 25 years of marriage. Has two adult children. Her son lives with her at home. 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    Social Determinants of Health   Financial Resource Strain:   . Difficulty of Paying Living Expenses:   Food Insecurity:   . Worried About Programme researcher, broadcasting/film/video in the Last Year:   . Barista in the Last Year:   Transportation Needs:   . Freight forwarder (Medical):   Marland Kitchen Lack of Transportation (Non-Medical):   Physical Activity:   . Days of Exercise per Week:   . Minutes of  Exercise per Session:   Stress:   . Feeling of Stress :   Social Connections:   . Frequency of Communication with Friends and Family:   . Frequency of Social Gatherings with Friends and Family:   . Attends Religious Services:   . Active Member of Clubs or Organizations:   . Attends Banker Meetings:   Marland Kitchen Marital Status:   Intimate Partner Violence:   . Fear of Current or Ex-Partner:   . Emotionally Abused:   Marland Kitchen Physically Abused:   . Sexually Abused:     Review of Systems  HENT: Positive for sinus pain.   Cardiovascular: Negative for palpitations.  Genitourinary: Negative.  Negative for dysuria and hematuria.  Musculoskeletal: Negative.  Negative for joint pain.  Skin: Negative.   Neurological: Negative.  Negative for sensory change.  All other systems reviewed and are negative.   Objective  Alert and oriented x3 no apparent respiratory distress Vitals as reported by the patient: Today's Vitals   04/20/20 1517  Weight: 218 lb (98.9 kg)  Height: 5\' 7"  (1.702 m)    There are no diagnoses linked to this encounter. [name] was seen today for cough, sinusitis, headache and sore throat.  Diagnoses and all orders for this visit:  Flu-like symptoms  Sinus congestion  Cough -     benzonatate (TESSALON) 200 MG capsule; Take 1 capsule (200 mg total) by mouth 2 (two) times daily as needed for cough. -     HYDROcodone-homatropine (HYCODAN) 5-1.5 MG/5ML syrup; Take 5 mLs by mouth at bedtime as needed for cough.  Upper respiratory tract infection, unspecified type -     azithromycin (ZITHROMAX) 250 MG tablet; Sig as indicated     I discussed the assessment and treatment plan with the patient. The patient was provided an opportunity to ask questions and all were answered. The patient agreed with the plan and demonstrated an understanding of the instructions.   The patient was advised to call back or seek an in-person evaluation if the symptoms worsen or if the  condition fails to improve as anticipated.  I provided 20 minutes of non-face-to-face time during this encounter.  Anna Butts, MD  Primary Care at [facility]

## 2020-05-12 ENCOUNTER — Other Ambulatory Visit: Payer: Self-pay | Admitting: Family Medicine

## 2020-05-12 DIAGNOSIS — N3941 Urge incontinence: Secondary | ICD-10-CM

## 2020-05-12 NOTE — Telephone Encounter (Signed)
Requested Prescriptions  Pending Prescriptions Disp Refills  . amLODipine (NORVASC) 5 MG tablet [Pharmacy Med Name: AMLODIPINE BESYLATE 5 MG TAB] 90 tablet 1    Sig: TAKE 1 TABLET BY MOUTH EVERY DAY     Cardiovascular:  Calcium Channel Blockers Passed - 05/12/2020  2:00 AM      Passed - Last BP in normal range    BP Readings from Last 1 Encounters:  12/12/19 130/80         Passed - Valid encounter within last 6 months    Recent Outpatient Visits          3 weeks ago Flu-like symptoms   Primary Care at [facility], [place], MD   5 months ago Uncontrolled type 2 diabetes mellitus with hyperglycemia, without long-term current use of insulin [facility])   [facility] Care at [facility], [name], MD   8 months ago Uncontrolled type 2 diabetes mellitus with hyperglycemia, without long-term current use of insulin [facility])   Primary Care at [facility], [name], MD   10 months ago Dizziness   [facility] Care at [facility], [name], MD   1 year ago Uncontrolled type 2 diabetes mellitus with hyperglycemia, without long-term current use of insulin [facility])   Primary Care at [facility], [name], MD      Future Appointments            In 1 month Myles Lipps, MD Primary Care at [facility], [facility]           . oxybutynin (DITROPAN-XL) 10 MG 24 hr tablet [Pharmacy Med Name: OXYBUTYNIN CL ER 10 MG TABLET] 90 tablet 0    Sig: TAKE 1 TABLET BY MOUTH EVERYDAY AT BEDTIME     Urology:  Bladder Agents Passed - 05/12/2020  2:00 AM      Passed - Valid encounter within last 12 months    Recent Outpatient Visits          3 weeks ago Flu-like symptoms   Primary Care at [facility], [place], MD   5 months ago Uncontrolled type 2 diabetes mellitus with hyperglycemia, without long-term current use of insulin [facility])   [facility] Care at [facility], [name], MD   8 months ago Uncontrolled type 2 diabetes mellitus with hyperglycemia, without long-term current use of insulin [facility])   Primary Care  at [facility], [name], MD   10 months ago Dizziness   Primary Care at [facility], [name], MD   1 year ago Uncontrolled type 2 diabetes mellitus with hyperglycemia, without long-term current use of insulin [facility])   Primary Care at [facility], [name], MD      Future Appointments            In 1 month Myles Lipps, MD Primary Care at [place], [facility]

## 2020-06-12 ENCOUNTER — Ambulatory Visit (INDEPENDENT_AMBULATORY_CARE_PROVIDER_SITE_OTHER): Payer: BC Managed Care – PPO | Admitting: Family Medicine

## 2020-06-12 ENCOUNTER — Other Ambulatory Visit: Payer: Self-pay

## 2020-06-12 ENCOUNTER — Encounter: Payer: Self-pay | Admitting: Family Medicine

## 2020-06-12 VITALS — BP 142/60 | HR 110 | Temp 98.1°F | Ht 67.0 in | Wt 218.0 lb

## 2020-06-12 DIAGNOSIS — E785 Hyperlipidemia, unspecified: Secondary | ICD-10-CM

## 2020-06-12 DIAGNOSIS — N6011 Diffuse cystic mastopathy of right breast: Secondary | ICD-10-CM

## 2020-06-12 DIAGNOSIS — I10 Essential (primary) hypertension: Secondary | ICD-10-CM

## 2020-06-12 DIAGNOSIS — E1165 Type 2 diabetes mellitus with hyperglycemia: Secondary | ICD-10-CM | POA: Diagnosis not present

## 2020-06-12 DIAGNOSIS — N6012 Diffuse cystic mastopathy of left breast: Secondary | ICD-10-CM

## 2020-06-12 MED ORDER — AMLODIPINE BESYLATE 5 MG PO TABS
5.0000 mg | ORAL_TABLET | Freq: Every day | ORAL | 1 refills | Status: DC
Start: 2020-06-12 — End: 2020-11-10

## 2020-06-12 NOTE — Progress Notes (Signed)
10/1/20214:15 PM  Anna Johns [birth year], 64 y.o., female [id number]  Chief Complaint  Patient presents with  . Medical Management of Chronic Issues    6 m f/u     HPI:   Patient is a 64 y.o. female with past medical history significant for DM2, HTN, UI who presents today for routine followup  Last OV April 2021 - started farxiga and atorvastatin, fu 3 months  She is overall doing ok Tolerating farxiga well She continues to takes [place] She held off amlodipine as she thought it was cause of increased urination She decided to work on [employer] for her cholesterol - has really worked on changing the way she cooks Riding her exercise bike, 20 min x 2-3 week Patient has fibrocystic breast tissues, her last mammo dec 2020, she however has noticed her right breast feels unusual thickness across bottom of her breast, no lumps or bumps She is requesting mammo order - goes to [facility] Readings from Last 3 Encounters:  06/12/20 218 lb (98.9 kg)  04/20/20 218 lb (98.9 kg)  12/12/19 220 lb (99.8 kg)   BP Readings from Last 3 Encounters:  06/12/20 (!) 142/60  12/12/19 130/80  08/23/19 130/80    Lab Results  Component Value Date   HGBA1C 10.3 (H) 12/12/2019   HGBA1C 8.6 (H) 07/12/2019   HGBA1C 8.2 (A) 10/22/2018   Lab Results  Component Value Date   LDLCALC 133 (H) 12/12/2019   CREATININE 1.12 (H) 12/12/2019    Depression screen PHQ 2/9 06/12/2020 04/20/2020 12/12/2019  Decreased Interest 0 0 0  Down, Depressed, Hopeless 0 0 0  PHQ - 2 Score 0 0 0  Altered sleeping - - -  Tired, decreased energy - - -  Change in appetite - - -  Feeling bad or failure about yourself  - - -  Trouble concentrating - - -  Moving slowly or fidgety/restless - - -  Suicidal thoughts - - -  PHQ-9 Score - - -  Difficult doing work/chores - - -    Fall Risk  06/12/2020 04/20/2020 12/12/2019 08/23/2019 07/12/2019  Falls in the past year? 0 0 0 0 0  Number falls in past yr: 0 0 0 0 0  Injury with  Fall? 0 0 0 0 0  Comment - - - - -  Follow up Falls evaluation completed Falls evaluation completed - - Falls evaluation completed     Allergies  Allergen Reactions  . Fish Allergy     Muscles and Bluefish  . Metformin And Related Diarrhea    Prior to Admission medications   Medication Sig Start Date End Date Taking? Authorizing Provider  dapagliflozin propanediol (FARXIGA) 10 MG TABS tablet Take 10 mg by mouth daily before breakfast. 01/05/20  Yes Rutherford Guys, MD  JANUVIA 100 MG tablet TAKE 1 TABLET BY MOUTH EVERY DAY 02/06/20  Yes Rutherford Guys, MD  oxybutynin (DITROPAN-XL) 10 MG 24 hr tablet TAKE 1 TABLET BY MOUTH EVERYDAY AT BEDTIME 05/12/20  Yes Rutherford Guys, MD  amLODipine (NORVASC) 5 MG tablet TAKE 1 TABLET BY MOUTH EVERY DAY Patient not taking: Reported on 06/12/2020 05/12/20   Rutherford Guys, MD  atorvastatin (LIPITOR) 20 MG tablet Take 1 tablet (20 mg total) by mouth daily. Patient not taking: Reported on 06/12/2020 01/05/20   Rutherford Guys, MD    Past Medical History:  Diagnosis Date  . Allergy   . Anemia   . DM2 (diabetes mellitus, type  2) ([place])   . Urinary incontinence     Past Surgical History:  Procedure Laterality Date  . ABDOMINAL HYSTERECTOMY     fibroids; ovaries intact.  . ACHILLES TENDON SURGERY Right 03/02/2017   Procedure: ACHILLES TENDON REPAIR, RIGHT;  Surgeon: Ninetta Lights, MD;  Location: [place];  Service: Orthopedics;  Laterality: Right;  . BONE EXCISION Right 03/02/2017   Procedure: EXCISION PARTIAL BONE TALUS/CALCANEUS RIGHT FOOT;  Surgeon: Ninetta Lights, MD;  Location: [place];  Service: Orthopedics;  Laterality: Right;  . CHOLECYSTECTOMY    . CHOLECYSTECTOMY    . KNEE SURGERY      Social History   Tobacco Use  . Smoking status: Never Smoker  . Smokeless tobacco: Never Used  Substance Use Topics  . Alcohol use: No    Family History  Problem Relation Age of Onset  . COPD Mother   .  Heart disease Mother        CHF  . Multiple myeloma Father   . Cancer Father        bone  . Lupus Brother     Review of Systems  Constitutional: Negative for chills and fever.  Respiratory: Negative for cough and shortness of breath.   Cardiovascular: Negative for chest pain, palpitations and leg swelling.  Gastrointestinal: Negative for abdominal pain, nausea and vomiting.     OBJECTIVE:  Today's Vitals   06/12/20 1553 06/12/20 1558  BP: (!) 156/81 (!) 142/60  Pulse: (!) 110   Temp: 98.1 F (36.7 C)   TempSrc: Temporal   SpO2: 96%   Weight: 218 lb (98.9 kg)   Height: _0  (1.702 m)    Body mass index is 34.14 kg/m.   Physical Exam Vitals and nursing note reviewed. Exam conducted with a chaperone present.  Constitutional:      Appearance: She is well-developed.  HENT:     Head: Normocephalic and atraumatic.     Mouth/Throat:     Pharynx: No oropharyngeal exudate.  Eyes:     General: No scleral icterus.    Extraocular Movements: Extraocular movements intact.     Conjunctiva/sclera: Conjunctivae normal.     Pupils: Pupils are equal, round, and reactive to light.  Cardiovascular:     Rate and Rhythm: Normal rate and regular rhythm.     Heart sounds: Normal heart sounds. No murmur heard.  No friction rub. No gallop.   Pulmonary:     Effort: Pulmonary effort is normal.     Breath sounds: Normal breath sounds. No wheezing, rhonchi or rales.  Chest:     Breasts:        Right: No nipple discharge, skin change or tenderness.        Left: No nipple discharge, skin change or tenderness.     Comments: Bilateral areas of thickened fibrocystic changes Musculoskeletal:     Cervical back: Neck supple.  Lymphadenopathy:     Upper Body:     Right upper body: No supraclavicular, axillary or pectoral adenopathy.     Left upper body: No supraclavicular, axillary or pectoral adenopathy.  Skin:    General: Skin is warm and dry.  Neurological:     Mental Status: She is  alert and oriented to person, place, and time.     No results found for this or any previous visit (from the past 24 hour(s)).  No results found.   ASSESSMENT and PLAN  1. Uncontrolled type 2 diabetes mellitus with hyperglycemia, without  long-term current use of insulin ([place]) Checking labs today, medications will be adjusted as needed. Cont working on [employer]. Consider changing januvia to rybelsus. - Hemoglobin A1c  2. Essential hypertension, benign Not at goal. Restart amlodipine  3. Hyperlipidemia, unspecified hyperlipidemia type Labs pending. Cont working on [employer], patient declines statin at this time - Lipid panel - Comprehensive metabolic panel  4. Fibrocystic breast changes, bilateral - MM DIAG BREAST TOMO BILATERAL; Future ([place])  Other orders - amLODipine (NORVASC) 5 MG tablet; Take 1 tablet (5 mg total) by mouth daily.  Return in about 3 months (around 09/12/2020).    Rutherford Guys, MD Primary Care at [place], [place] [address] Ph.  [phone] Fax [phone]

## 2020-06-12 NOTE — Patient Instructions (Signed)
° ° ° °  If you have lab work done today you will be contacted with your lab results within the next 2 weeks.  If you have not heard from us then please contact us. The fastest way to get your results is to register for My Chart. ° ° °IF you received an x-ray today, you will receive an invoice from [facility]. Please contact [facility] at [phone] with questions or concerns regarding your invoice.  ° °IF you received labwork today, you will receive an invoice from [facility]. Please contact [facility] at [phone] with questions or concerns regarding your invoice.  ° °Our billing staff will not be able to assist you with questions regarding bills from these companies. ° °You will be contacted with the lab results as soon as they are available. The fastest way to get your results is to activate your My Chart account. Instructions are located on the last page of this paperwork. If you have not heard from us regarding the results in 2 weeks, please contact this office. °  ° ° ° °

## 2020-06-13 LAB — COMPREHENSIVE METABOLIC PANEL
ALT: 10 IU/L (ref 0–32)
AST: 14 IU/L (ref 0–40)
Albumin/Globulin Ratio: 1.7 (ref 1.2–2.2)
Albumin: 4.3 g/dL (ref 3.8–4.8)
Alkaline Phosphatase: 124 IU/L — ABNORMAL HIGH (ref 44–121)
BUN/Creatinine Ratio: 15 (ref 12–28)
BUN: 15 mg/dL (ref 8–27)
Bilirubin Total: 0.6 mg/dL (ref 0.0–1.2)
CO2: 26 mmol/L (ref 20–29)
Calcium: 9.3 mg/dL (ref 8.7–10.3)
Chloride: 102 mmol/L (ref 96–106)
Creatinine, Ser: 1.02 mg/dL — ABNORMAL HIGH (ref 0.57–1.00)
GFR calc Af Amer: 68 mL/min/{1.73_m2} (ref >59)
GFR calc non Af Amer: 59 mL/min/{1.73_m2} — ABNORMAL LOW (ref >59)
Globulin, Total: 2.6 g/dL (ref 1.5–4.5)
Glucose: 212 mg/dL — ABNORMAL HIGH (ref 65–99)
Potassium: 4.1 mmol/L (ref 3.5–5.2)
Sodium: 140 mmol/L (ref 134–144)
Total Protein: 6.9 g/dL (ref 6.0–8.5)

## 2020-06-13 LAB — LIPID PANEL
Chol/HDL Ratio: 4.3 ratio (ref 0.0–4.4)
Cholesterol, Total: 187 mg/dL (ref 100–199)
HDL: 43 mg/dL (ref >39)
LDL Chol Calc (NIH): 110 mg/dL — ABNORMAL HIGH (ref 0–99)
Triglycerides: 195 mg/dL — ABNORMAL HIGH (ref 0–149)
VLDL Cholesterol Cal: 34 mg/dL (ref 5–40)

## 2020-06-13 LAB — HEMOGLOBIN A1C
Est. average glucose Bld gHb Est-mCnc: 258 mg/dL
Hgb A1c MFr Bld: 10.6 % — ABNORMAL HIGH (ref 4.8–5.6)

## 2020-06-23 ENCOUNTER — Other Ambulatory Visit: Payer: Self-pay | Admitting: Family Medicine

## 2020-06-23 MED ORDER — RYBELSUS 3 MG PO TABS: 3.0000 mg | ORAL_TABLET | Freq: Every day | ORAL | 0 refills | Status: DC

## 2020-06-23 MED ORDER — RYBELSUS 7 MG PO TABS: 7.0000 mg | ORAL_TABLET | Freq: Every day | ORAL | 5 refills | Status: DC

## 2020-06-24 ENCOUNTER — Other Ambulatory Visit: Payer: Self-pay | Admitting: Family Medicine

## 2020-11-10 ENCOUNTER — Encounter: Payer: Self-pay | Admitting: Family Medicine

## 2020-11-10 ENCOUNTER — Other Ambulatory Visit: Payer: Self-pay

## 2020-11-10 ENCOUNTER — Ambulatory Visit: Payer: BC Managed Care – PPO | Admitting: Family Medicine

## 2020-11-10 VITALS — BP 142/79 | HR 98 | Temp 98.2°F | Ht 67.0 in | Wt 219.0 lb

## 2020-11-10 DIAGNOSIS — L600 Ingrowing nail: Secondary | ICD-10-CM | POA: Diagnosis not present

## 2020-11-10 DIAGNOSIS — Z1211 Encounter for screening for malignant neoplasm of colon: Secondary | ICD-10-CM

## 2020-11-10 DIAGNOSIS — I10 Essential (primary) hypertension: Secondary | ICD-10-CM

## 2020-11-10 DIAGNOSIS — E1165 Type 2 diabetes mellitus with hyperglycemia: Secondary | ICD-10-CM

## 2020-11-10 DIAGNOSIS — E785 Hyperlipidemia, unspecified: Secondary | ICD-10-CM | POA: Diagnosis not present

## 2020-11-10 MED ORDER — VALSARTAN 80 MG PO TABS: 80.0000 mg | ORAL_TABLET | Freq: Every day | ORAL | 3 refills | Status: AC

## 2020-11-10 MED ORDER — TRIAMCINOLONE ACETONIDE 0.1 % EX CREA: 1.0000 "application " | TOPICAL_CREAM | Freq: Two times a day (BID) | CUTANEOUS | 0 refills | Status: AC

## 2020-11-10 MED ORDER — RYBELSUS 7 MG PO TABS: 7.0000 mg | ORAL_TABLET | Freq: Every day | ORAL | 5 refills | Status: AC

## 2020-11-10 NOTE — Patient Instructions (Addendum)
cold water soaks mixed with salt or Epsom salts three times per day.  After each soak, a mid-potency or high-potency topical steroid can be applied.  triamcinolone acetoinide ointment 0.1%    Ingrown Toenail An ingrown toenail occurs when the corner or sides of a toenail grow into the surrounding skin. This causes discomfort and pain. The big toe is most commonly affected, but any of the toes can be affected. If an ingrown toenail is not treated, it can become infected. What are the causes? This condition may be caused by:  Wearing shoes that are too small or tight.  An injury, such as stubbing your toe or having your toe stepped on.  Improper cutting or care of your toenails.  Having nail or foot abnormalities that were present from birth (congenital abnormalities), such as having a nail that is too big for your toe. What increases the risk? The following factors may make you more likely to develop ingrown toenails:  Age. Nails tend to get thicker with age, so ingrown nails are more common among older people.  Cutting your toenails incorrectly, such as cutting them very short or cutting them unevenly. An ingrown toenail is more likely to get infected if you have:  Diabetes.  Blood flow (circulation) problems. What are the signs or symptoms? Symptoms of an ingrown toenail may include:  Pain, soreness, or tenderness.  Redness.  Swelling.  Hardening of the skin that surrounds the toenail. Signs that an ingrown toenail may be infected include:  Fluid or pus.  Symptoms that get worse instead of better. How is this diagnosed? An ingrown toenail may be diagnosed based on your medical history, your symptoms, and a physical exam. If you have fluid or blood coming from your toenail, a sample may be collected to test for the specific type of bacteria that is causing the infection. How is this treated? Treatment depends on how severe your ingrown toenail is. You may be able to  care for your toenail at home.  If you have an infection, you may be prescribed antibiotic medicines.  If you have fluid or pus draining from your toenail, your health care provider may drain it.  If you have trouble walking, you may be given crutches to use.  If you have a severe or infected ingrown toenail, you may need a procedure to remove part or all of the nail. Follow these instructions at home: Foot care  Do not pick at your toenail or try to remove it yourself.  Soak your foot in warm, soapy water. Do this for 20 minutes, 3 times a day, or as often as told by your health care provider. This helps to keep your toe clean and keep your skin soft.  Wear shoes that fit well and are not too tight. Your health care provider may recommend that you wear open-toed shoes while you heal.  Trim your toenails regularly and carefully. Cut your toenails straight across to prevent injury to the skin at the corners of the toenail. Do not cut your nails in a curved shape.  Keep your feet clean and dry to help prevent infection.   Medicines  Take over-the-counter and prescription medicines only as told by your health care provider.  If you were prescribed an antibiotic, take it as told by your health care provider. Do not stop taking the antibiotic even if you start to feel better. Activity  Return to your normal activities as told by your health care provider. Ask your  health care provider what activities are safe for you.  Avoid activities that cause pain. General instructions  If your health care provider told you to use crutches to help you move around, use them as instructed.  Keep all follow-up visits as told by your health care provider. This is important. Contact a health care provider if:  You have more redness, swelling, pain, or other symptoms that do not improve with treatment.  You have fluid, blood, or pus coming from your toenail. Get help right away if:  You have a red  streak on your skin that starts at your foot and spreads up your leg.  You have a fever. Summary  An ingrown toenail occurs when the corner or sides of a toenail grow into the surrounding skin. This causes discomfort and pain. The big toe is most commonly affected, but any of the toes can be affected.  If an ingrown toenail is not treated, it can become infected.  Fluid or pus draining from your toenail is a sign of infection. Your health care provider may need to drain it. You may be given antibiotics to treat the infection.  Trimming your toenails regularly and properly can help you prevent an ingrown toenail. This information is not intended to replace advice given to you by your health care provider. Make sure you discuss any questions you have with your health care provider. Document Revised: 12/21/2018 Document Reviewed: 05/17/2017 Elsevier Patient Education  2021 [employer].  If you have lab work done today you will be contacted with your lab results within the next 2 weeks.  If you have not heard from [place] then please contact us. The fastest way to get your results is to register for My Chart.   IF you received an x-ray today, you will receive an invoice from [facility]. Please contact [facility] at [phone] with questions or concerns regarding your invoice.   IF you received labwork today, you will receive an invoice from [facility]. Please contact [facility] at [phone] with questions or concerns regarding your invoice.   Our billing staff will not be able to assist you with questions regarding bills from these companies.  You will be contacted with the lab results as soon as they are available. The fastest way to get your results is to activate your My Chart account. Instructions are located on the last page of this paperwork. If you have not heard from [place] regarding the results in 2 weeks, please contact this office.

## 2020-11-10 NOTE — Addendum Note (Signed)
Addended by: Alm Bustard R on: 11/10/2020 05:12 PM   Modules accepted: Orders

## 2020-11-10 NOTE — Progress Notes (Signed)
3/1/20222:36 PM  Anna Johns 11/03/1955, 65 y.o., female [id number]  Chief Complaint  Patient presents with  . ingrown toe nail on L grt toe     X 2 to 3 days - still tender to touch, using peroxide and neosporin   . Diabetes    HPI:   Patient is a 65 y.o. female with past medical history significant for DM, HTN who presents today for ingrown toenail.  Left great toe Started hurting 3 days ago Tried to fix this with nail cutter Using peroxide and neosporin Never had this happen in the past   DM Farxiga 10 mg: has not been taking Rybelsus $RemoveBeforeD'7mg'lbMBNmUsPANJUo$  daily (increased last OV Had been on Metformin Transitioned to Januvia then to Rybelsus Lab Results  Component Value Date   HGBA1C 10.6 (H) 06/12/2020     HTN Amlodipine 5 mg: has not been taking This medication made her urinate frequently BP Readings from Last 3 Encounters:  11/10/20 (!) 142/79  06/12/20 (!) 142/60  12/12/19 130/80   HLD Atorvastatin 20 mg: has not been taking Lab Results  Component Value Date   CHOL 187 06/12/2020   HDL 43 06/12/2020   LDLCALC 110 (H) 06/12/2020   TRIG 195 (H) 06/12/2020   CHOLHDL 4.3 06/12/2020     Depression screen PHQ 2/9 11/10/2020 06/12/2020 04/20/2020  Decreased Interest 0 0 0  Down, Depressed, Hopeless 0 0 0  PHQ - 2 Score 0 0 0  Altered sleeping - - -  Tired, decreased energy - - -  Change in appetite - - -  Feeling bad or failure about yourself  - - -  Trouble concentrating - - -  Moving slowly or fidgety/restless - - -  Suicidal thoughts - - -  PHQ-9 Score - - -  Difficult doing work/chores - - -    Fall Risk  11/10/2020 06/12/2020 04/20/2020 12/12/2019 08/23/2019  Falls in the past year? 0 0 0 0 0  Number falls in past yr: 0 0 0 0 0  Injury with Fall? 0 0 0 0 0  Comment - - - - -  Follow up Falls evaluation completed Falls evaluation completed Falls evaluation completed - -     Allergies  Allergen Reactions  . Fish Allergy     Muscles and Bluefish  .  Metformin And Related Diarrhea    Prior to Admission medications   Medication Sig Start Date End Date Taking? Authorizing Provider  oxybutynin (DITROPAN-XL) 10 MG 24 hr tablet TAKE 1 TABLET BY MOUTH EVERYDAY AT BEDTIME 05/12/20  Yes Jacelyn Pi, Irma M, MD  Semaglutide (RYBELSUS) 7 MG TABS Take 7 mg by mouth daily. 07/24/20  Yes Jacelyn Pi, Lilia Argue, MD  amLODipine (NORVASC) 5 MG tablet Take 1 tablet (5 mg total) by mouth daily. Patient not taking: Reported on 11/10/2020 06/12/20   Jacelyn Pi, Lilia Argue, MD  atorvastatin (LIPITOR) 20 MG tablet Take 1 tablet (20 mg total) by mouth daily. Patient not taking: No sig reported 01/05/20   Jacelyn Pi, Lilia Argue, MD  dapagliflozin propanediol (FARXIGA) 10 MG TABS tablet Take 10 mg by mouth daily before breakfast. 01/05/20   Jacelyn Pi, Lilia Argue, MD    Past Medical History:  Diagnosis Date  . Allergy   . Anemia   . DM2 (diabetes mellitus, type 2) ([place])   . Urinary incontinence     Past Surgical History:  Procedure Laterality Date  . ABDOMINAL HYSTERECTOMY     fibroids; ovaries intact.  Marland Kitchen  ACHILLES TENDON SURGERY Right 03/02/2017   Procedure: ACHILLES TENDON REPAIR, RIGHT;  Surgeon: Ninetta Lights, MD;  Location: [place];  Service: Orthopedics;  Laterality: Right;  . BONE EXCISION Right 03/02/2017   Procedure: EXCISION PARTIAL BONE TALUS/CALCANEUS RIGHT FOOT;  Surgeon: Ninetta Lights, MD;  Location: [place];  Service: Orthopedics;  Laterality: Right;  . CHOLECYSTECTOMY    . CHOLECYSTECTOMY    . KNEE SURGERY      Social History   Tobacco Use  . Smoking status: Never Smoker  . Smokeless tobacco: Never Used  Substance Use Topics  . Alcohol use: No    Family History  Problem Relation Age of Onset  . COPD Mother   . Heart disease Mother        CHF  . Multiple myeloma Father   . Cancer Father        bone  . Lupus Brother     Review of Systems  Constitutional: Negative for chills, fever and  malaise/fatigue.  Eyes: Negative for blurred vision and double vision.  Respiratory: Negative for cough, shortness of breath and wheezing.   Cardiovascular: Negative for chest pain, palpitations and leg swelling.  Gastrointestinal: Negative for abdominal pain, blood in stool, constipation, diarrhea, heartburn, nausea and vomiting.  Genitourinary: Negative for dysuria, frequency and hematuria.  Musculoskeletal: Negative for back pain and joint pain.  Skin: Negative for rash.       Left great toe pain  Neurological: Negative for dizziness, weakness and headaches.     OBJECTIVE:  Today's Vitals   11/10/20 1408  BP: (!) 142/79  Pulse: 98  Temp: 98.2 F (36.8 C)  SpO2: 97%  Weight: 219 lb (99.3 kg)  Height: $Remove'5\' 7"'HbRtFaz$  (1.702 m)   Body mass index is 34.3 kg/m.   Physical Exam Constitutional:      General: She is not in acute distress.    Appearance: Normal appearance. She is not ill-appearing.  HENT:     Head: Normocephalic.  Cardiovascular:     Rate and Rhythm: Normal rate and regular rhythm.     Pulses: Normal pulses.     Heart sounds: Normal heart sounds. No murmur heard. No friction rub. No gallop.   Pulmonary:     Effort: Pulmonary effort is normal. No respiratory distress.     Breath sounds: Normal breath sounds. No stridor. No wheezing, rhonchi or rales.  Abdominal:     General: Bowel sounds are normal.     Palpations: Abdomen is soft.     Tenderness: There is no abdominal tenderness.  Musculoskeletal:        General: Tenderness (Left Great toe ingrown toenail, soft, no swelling, no errythema) present.     Right lower leg: No edema.     Left lower leg: No edema.  Skin:    General: Skin is warm and dry.  Neurological:     Mental Status: She is alert and oriented to person, place, and time.  Psychiatric:        Mood and Affect: Mood normal.        Behavior: Behavior normal.     No results found for this or any previous visit (from the past 24 hour(s)).  No  results found.   ASSESSMENT and PLAN  Problem List Items Addressed This Visit      Endocrine   Uncontrolled type 2 diabetes mellitus with hyperglycemia, without long-term current use of insulin ([place]) - Primary   Relevant Medications  valsartan (DIOVAN) 80 MG tablet   Semaglutide (RYBELSUS) 7 MG TABS   Other Relevant Orders   Hemoglobin A1c   Microalbumin/Creatinine Ratio, Urine    Other Visit Diagnoses    Essential hypertension, benign       Relevant Medications   valsartan (DIOVAN) 80 MG tablet   Other Relevant Orders   CMP14+EGFR   Hyperlipidemia, unspecified hyperlipidemia type       Relevant Medications   valsartan (DIOVAN) 80 MG tablet   Other Relevant Orders   Lipid Panel   Ingrown toenail of left foot       Relevant Medications   triamcinolone (KENALOG) 0.1 %   Colon cancer screening       Relevant Orders   Ambulatory referral to Gastroenterology      Plan cold water soaks mixed with salt or Epsom salts three times per day.  After each soak, a mid-potency or high-potency topical steroid can be applied.  triamcinolone acetoinide ointment 0.1%  Will follow up with lab results (A1c and lipid) for medication management Start Valsartan 80 mg daily, r/se/b of medication discussed   Return in about 3 months (around 02/10/2021).    Huston Foley Romney Compean, FNP-BC Primary Care at [facility] [place], [place] [address] Ph.  [phone] Fax [phone]

## 2020-11-11 ENCOUNTER — Other Ambulatory Visit: Payer: Self-pay | Admitting: Family Medicine

## 2020-11-11 ENCOUNTER — Telehealth: Payer: Self-pay | Admitting: *Deleted

## 2020-11-11 DIAGNOSIS — E1165 Type 2 diabetes mellitus with hyperglycemia: Secondary | ICD-10-CM

## 2020-11-11 LAB — CMP14+EGFR
ALT: 7 IU/L (ref 0–32)
AST: 12 IU/L (ref 0–40)
Albumin/Globulin Ratio: 1.6 (ref 1.2–2.2)
Albumin: 4.3 g/dL (ref 3.8–4.8)
Alkaline Phosphatase: 114 IU/L (ref 44–121)
BUN/Creatinine Ratio: 16 (ref 12–28)
BUN: 15 mg/dL (ref 8–27)
Bilirubin Total: 0.5 mg/dL (ref 0.0–1.2)
CO2: 21 mmol/L (ref 20–29)
Calcium: 9.2 mg/dL (ref 8.7–10.3)
Chloride: 100 mmol/L (ref 96–106)
Creatinine, Ser: 0.93 mg/dL (ref 0.57–1.00)
Globulin, Total: 2.7 g/dL (ref 1.5–4.5)
Glucose: 230 mg/dL — ABNORMAL HIGH (ref 65–99)
Potassium: 3.7 mmol/L (ref 3.5–5.2)
Sodium: 138 mmol/L (ref 134–144)
Total Protein: 7 g/dL (ref 6.0–8.5)
eGFR: 69 mL/min/{1.73_m2} (ref >59)

## 2020-11-11 LAB — LIPID PANEL
Chol/HDL Ratio: 4.3 ratio (ref 0.0–4.4)
Cholesterol, Total: 180 mg/dL (ref 100–199)
HDL: 42 mg/dL (ref >39)
LDL Chol Calc (NIH): 105 mg/dL — ABNORMAL HIGH (ref 0–99)
Triglycerides: 190 mg/dL — ABNORMAL HIGH (ref 0–149)
VLDL Cholesterol Cal: 33 mg/dL (ref 5–40)

## 2020-11-11 LAB — HEMOGLOBIN A1C
Est. average glucose Bld gHb Est-mCnc: 209 mg/dL
Hgb A1c MFr Bld: 8.9 % — ABNORMAL HIGH (ref 4.8–5.6)

## 2020-11-11 MED ORDER — DAPAGLIFLOZIN PROPANEDIOL 5 MG PO TABS
5.0000 mg | ORAL_TABLET | Freq: Every day | ORAL | 3 refills | Status: DC
Start: 2020-11-11 — End: 2020-11-11

## 2020-11-11 MED ORDER — ROSUVASTATIN CALCIUM 10 MG PO TABS
10.0000 mg | ORAL_TABLET | Freq: Every day | ORAL | 3 refills | Status: AC
Start: 2020-11-11 — End: ?

## 2020-11-11 NOTE — Progress Notes (Addendum)
Declined Starting [place] or increasing Rybelsus. Wants to make adjustments on her own and recheck A1c in 3 months. Agreed to start Crestor.

## 2020-11-11 NOTE — Telephone Encounter (Signed)
Reviewed latest lab results and provider's note with the patient. The patient voiced she could do better and prefers to stay with the Rybelsus 7 mg tabs once daily. Informed patient Crestor and Kenalog were at the pharmacy.

## 2020-11-11 NOTE — Progress Notes (Signed)
Your cholesterol is elevated. I would recommend starting daily Crestor. I have sent this medication to your pharmacy. Also Your A1c is better but still elevated. I was calling to see if you would prefer to start a new medication Farxiga daily or to increase your Rybelsus from 7mg  to 14 mg? I have sent the to your pharmacy in case you decide that option.

## 2021-02-24 ENCOUNTER — Ambulatory Visit: Payer: BC Managed Care – PPO | Admitting: Family Medicine
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
## 2022-08-08 ENCOUNTER — Ambulatory Visit
Admission: EM | Admit: 2022-08-08 | Discharge: 2022-08-08 | Disposition: A | Discharge status: Home / Self Care | Payer: BC Managed Care – PPO | Attending: Internal Medicine | Admitting: Internal Medicine

## 2022-08-08 ENCOUNTER — Encounter: Payer: Self-pay | Admitting: Emergency Medicine

## 2022-08-08 DIAGNOSIS — E1165 Type 2 diabetes mellitus with hyperglycemia: Secondary | ICD-10-CM | POA: Diagnosis not present

## 2022-08-08 DIAGNOSIS — J069 Acute upper respiratory infection, unspecified: Secondary | ICD-10-CM

## 2022-08-08 DIAGNOSIS — Z79899 Other long term (current) drug therapy: Secondary | ICD-10-CM | POA: Insufficient documentation

## 2022-08-08 DIAGNOSIS — U071 COVID-19: Secondary | ICD-10-CM | POA: Insufficient documentation

## 2022-08-08 DIAGNOSIS — Z7984 Long term (current) use of oral hypoglycemic drugs: Secondary | ICD-10-CM | POA: Insufficient documentation

## 2022-08-08 LAB — POCT INFLUENZA A/B
Influenza A, POC: NEGATIVE
Influenza B, POC: NEGATIVE

## 2022-08-08 MED ORDER — FLUTICASONE PROPIONATE 50 MCG/ACT NA SUSP: 1.0000 | Freq: Every day | NASAL | 0 refills | Status: AC

## 2022-08-08 MED ORDER — BENZONATATE 100 MG PO CAPS: 100.0000 mg | ORAL_CAPSULE | Freq: Three times a day (TID) | ORAL | 0 refills | Status: AC | PRN

## 2022-08-08 MED ORDER — ACETAMINOPHEN 325 MG PO TABS
650.0000 mg | ORAL_TABLET | Freq: Once | ORAL | Status: AC
  Administered 2022-08-08: 650 mg via ORAL

## 2022-08-08 NOTE — ED Triage Notes (Signed)
Pt is present today with c/o cough, chest congestion, vomiting, and fatigue x2 days

## 2022-08-08 NOTE — ED Provider Notes (Signed)
[facility]    CSN: [id number] Arrival date & time: 08/08/22  0847      History   Chief Complaint Chief Complaint  Patient presents with   Cough    congestion   Emesis   Fatigue    HPI KAYLINA CAHUE is a 66 y.o. female.   Patient presents with nasal congestion, cough, fatigue, nausea, nonbloody emesis, sore throat that started 2 days ago.  Patient has taken robitussin over-the-counter as well as Tylenol yesterday with minimal improvement.  Patient reports that she has "felt feverish" but did not take temperature with thermometer.  She denies any known sick contacts.  Denies chest pain, shortness of breath, diarrhea, abdominal pain or ear pain.  Patient denies history of asthma or COPD and is not a cigarette smoker.   Cough Emesis   Past Medical History:  Diagnosis Date   Allergy    Anemia    DM2 (diabetes mellitus, type 2) ([place])    Urinary incontinence     Patient Active Problem List   Diagnosis Date Noted   Uncontrolled type 2 diabetes mellitus with hyperglycemia, without long-term current use of insulin ([place]) 09/15/2015    Past Surgical History:  Procedure Laterality Date   ABDOMINAL HYSTERECTOMY     fibroids; ovaries intact.   ACHILLES TENDON SURGERY Right 03/02/2017   Procedure: ACHILLES TENDON REPAIR, RIGHT;  Surgeon: Ninetta Lights, MD;  Location: [facility] [facility];  Service: Orthopedics;  Laterality: Right;   BONE EXCISION Right 03/02/2017   Procedure: EXCISION PARTIAL BONE TALUS/CALCANEUS RIGHT FOOT;  Surgeon: Ninetta Lights, MD;  Location: [facility];  Service: Orthopedics;  Laterality: Right;   CHOLECYSTECTOMY     CHOLECYSTECTOMY     KNEE SURGERY      OB History   No obstetric history on file.      Home Medications    Prior to Admission medications   Medication Sig Start Date End Date Taking? Authorizing Provider  benzonatate (TESSALON) 100 MG capsule Take 1 capsule (100 mg total) by mouth every 8  (eight) hours as needed for cough. 08/08/22  Yes Ilithyia Titzer, Hildred Alamin E, FNP  fluticasone (FLONASE) 50 MCG/ACT nasal spray Place 1 spray into both nostrils daily. 08/08/22  Yes Clodagh Odenthal, Hildred Alamin E, FNP  oxybutynin (DITROPAN-XL) 10 MG 24 hr tablet TAKE 1 TABLET BY MOUTH EVERYDAY AT BEDTIME 05/12/20   Jacelyn Pi, Lilia Argue, MD  rosuvastatin (CRESTOR) 10 MG tablet Take 1 tablet (10 mg total) by mouth daily. 11/11/20   Just, Laurita Quint, FNP  Semaglutide (RYBELSUS) 7 MG TABS Take 7 mg by mouth daily. 11/10/20   Just, Laurita Quint, FNP  triamcinolone (KENALOG) 0.1 % Apply 1 application topically 2 (two) times daily. 11/10/20   Just, Laurita Quint, FNP  valsartan (DIOVAN) 80 MG tablet Take 1 tablet (80 mg total) by mouth daily. 11/10/20   Just, Laurita Quint, FNP    Family History Family History  Problem Relation Age of Onset   COPD Mother    Heart disease Mother        CHF   Multiple myeloma Father    Cancer Father        bone   Lupus Brother     Social History Social History   Tobacco Use   Smoking status: Never   Smokeless tobacco: Never  Vaping Use   Vaping Use: Never used  Substance Use Topics   Alcohol use: No   Drug use: Never  Allergies   Fish allergy and Metformin and related   Review of Systems Review of Systems per HPI  Physical Exam Triage Vital Signs ED Triage Vitals  Enc Vitals Group     BP 08/08/22 0959 (!) 150/76     Pulse Rate 08/08/22 0959 (!) 109     Resp 08/08/22 0959 18     Temp 08/08/22 1001 99.5 F (37.5 C)     Temp src --      SpO2 --      Weight --      Height --      Head Circumference --      Peak Flow --      Pain Score 08/08/22 0959 0     Pain Loc --      Pain Edu? --      Excl. in [place]? --    No data found.  Updated Vital Signs BP (!) 150/76   Pulse (!) 103   Temp 98.2 F (36.8 C)   Resp 18   SpO2 97%   Visual Acuity Right Eye Distance:   Left Eye Distance:   Bilateral Distance:    Right Eye Near:   Left Eye Near:    Bilateral Near:     Physical  Exam Constitutional:      General: She is not in acute distress.    Appearance: Normal appearance. She is not toxic-appearing or diaphoretic.  HENT:     Head: Normocephalic and atraumatic.     Right Ear: Tympanic membrane and ear canal normal.     Left Ear: Tympanic membrane and ear canal normal.     Nose: Congestion present.     Mouth/Throat:     Mouth: Mucous membranes are moist.     Pharynx: Posterior oropharyngeal erythema present.  Eyes:     Extraocular Movements: Extraocular movements intact.     Conjunctiva/sclera: Conjunctivae normal.     Pupils: Pupils are equal, round, and reactive to light.  Cardiovascular:     Rate and Rhythm: Normal rate and regular rhythm.     Pulses: Normal pulses.     Heart sounds: Normal heart sounds.  Pulmonary:     Effort: Pulmonary effort is normal. No respiratory distress.     Breath sounds: Normal breath sounds. No stridor. No wheezing, rhonchi or rales.  Abdominal:     General: Abdomen is flat. Bowel sounds are normal.     Palpations: Abdomen is soft.  Musculoskeletal:        General: Normal range of motion.     Cervical back: Normal range of motion.  Skin:    General: Skin is warm and dry.  Neurological:     General: No focal deficit present.     Mental Status: She is alert and oriented to person, place, and time. Mental status is at baseline.  Psychiatric:        Mood and Affect: Mood normal.        Behavior: Behavior normal.      UC Treatments / Results  Labs (all labs ordered are listed, but only abnormal results are displayed) Labs Reviewed  SARS CORONAVIRUS 2 (TAT 6-24 HRS)  POCT INFLUENZA A/B    EKG   Radiology No results found.  Procedures Procedures (including critical care time)  Medications Ordered in UC Medications  acetaminophen (TYLENOL) tablet 650 mg (650 mg Oral Given 08/08/22 1021)    Initial Impression / Assessment and Plan / UC Course  I have reviewed the  triage vital signs and the nursing  notes.  Pertinent labs & imaging results that were available during my care of the patient were reviewed by me and considered in my medical decision making (see chart for details).     Patient presents with symptoms likely from a viral upper respiratory infection. Differential includes bacterial pneumonia, sinusitis, allergic rhinitis, Covid 19, flu, RSV. Do not suspect underlying cardiopulmonary process. Symptoms seem unlikely related to ACS, CHF or COPD exacerbations, pneumonia, pneumothorax. Patient is nontoxic appearing and not in need of emergent medical intervention.  Rapid flu is negative.  COVID test pending. Do not think strep testing is necessary given appearance of posterior pharynx on exam.   Recommended symptom control with  medications and supportive care.  Patient sent prescriptions to help alleviate symptoms.  Patient had low grade temp and mildly elevated HR in urgent care.  Tylenol administered in urgent care with improvement in temp and heart rate.  Suspect heart rate was related to temp given it subsequently decreased with Tylenol administration.  No need for further workup for this at this time given this.   Return if symptoms fail to improve in 1-2 weeks or you develop shortness of breath, chest pain, severe headache. Patient states understanding and is agreeable.  Discharged with PCP followup.  Final Clinical Impressions(s) / UC Diagnoses   Final diagnoses:  Viral upper respiratory tract infection with cough     Discharge Instructions      You have a viral upper respiratory infection which should run its course and self resolve with symptomatic treatment.  I have prescribed you 2 medications to help alleviate symptoms.  Your rapid flu test was negative.  COVID test is pending.  We will call if it is positive.  Please follow-up if symptoms persist or worsen.     ED Prescriptions     Medication Sig Dispense Auth. Provider   fluticasone (FLONASE) 50 MCG/ACT nasal  spray Place 1 spray into both nostrils daily. 16 g Eduardo Honor, Hildred Alamin E, [place]   benzonatate (TESSALON) 100 MG capsule Take 1 capsule (100 mg total) by mouth every 8 (eight) hours as needed for cough. 21 capsule [place], Michele Rockers, [place]      PDMP not reviewed this encounter.   Teodora Medici, [place] 08/08/22 1054

## 2022-08-08 NOTE — Discharge Instructions (Signed)
You have a viral upper respiratory infection which should run its course and self resolve with symptomatic treatment.  I have prescribed you 2 medications to help alleviate symptoms.  Your rapid flu test was negative.  COVID test is pending.  We will call if it is positive.  Please follow-up if symptoms persist or worsen.

## 2022-08-09 LAB — SARS CORONAVIRUS 2 (TAT 6-24 HRS): SARS Coronavirus 2: POSITIVE — AB

## 2022-08-12 ENCOUNTER — Telehealth: Payer: Self-pay

## 2022-09-13 ENCOUNTER — Encounter: Payer: Self-pay | Admitting: Obstetrics

## 2023-06-18 ENCOUNTER — Ambulatory Visit
Admission: RE | Admit: 2023-06-18 | Discharge: 2023-06-18 | Disposition: A | Discharge status: Home / Self Care | Payer: BC Managed Care – PPO | Source: Ambulatory Visit | Attending: Internal Medicine | Admitting: Internal Medicine

## 2023-06-18 VITALS — BP 137/72 | HR 85 | Temp 99.0°F | Ht 67.0 in | Wt 215.0 lb

## 2023-06-18 DIAGNOSIS — M25562 Pain in left knee: Secondary | ICD-10-CM | POA: Diagnosis not present

## 2023-06-18 DIAGNOSIS — M25512 Pain in left shoulder: Secondary | ICD-10-CM | POA: Diagnosis not present

## 2023-06-18 DIAGNOSIS — M25561 Pain in right knee: Secondary | ICD-10-CM

## 2023-06-18 MED ORDER — TIZANIDINE HCL 2 MG PO TABS: 2.0000 mg | ORAL_TABLET | Freq: Two times a day (BID) | ORAL | 0 refills | Status: AC | PRN

## 2023-06-18 MED ORDER — PREDNISONE 20 MG PO TABS: 40.0000 mg | ORAL_TABLET | Freq: Every day | ORAL | 0 refills | Status: AC

## 2023-06-18 NOTE — ED Triage Notes (Signed)
Patient presents with bilateral knee pain x 2 weeks, the lower part of body, left arm pain that travels up the neck area x day 2. Patient treated with Voltaren cream and Tylenol.

## 2023-06-18 NOTE — Discharge Instructions (Signed)
I have prescribed you prednisone to decrease inflammation associated with your pain.  Please monitor blood glucose very closely at home while taking prednisone.  Muscle relaxer has also been prescribed.  This can make you drowsy so do not drive or drink alcohol with taking it.  Follow-up with your orthopedist tomorrow for further evaluation and management.

## 2023-06-18 NOTE — ED Provider Notes (Addendum)
[facility]    CSN: [id number] Arrival date & time: 06/18/23  1250      History   Chief Complaint No chief complaint on file.   HPI Anna Johns is a 67 y.o. female.   Patient presents with bilateral knee pain and left shoulder pain.  Reports knee pain has been present for about 2 or more weeks.  Right knee pain started first, then left knee pain.  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  States that she last saw orthopedist approximately 1 year ago and an injection into the joint space was provided.  Left shoulder pain started about 2 days ago.  Patient states pain starts in the lateral shoulder and radiates up her neck.  Denies any injury to any of the areas of pain.  Has taken Tylenol for pain and applied Voltaren gel to her knees.  Denies numbness or tingling.     Past Medical History:  Diagnosis Date   Allergy    Anemia    DM2 (diabetes mellitus, type 2) (HCC)    Urinary incontinence     Patient Active Problem List   Diagnosis Date Noted   Uncontrolled type 2 diabetes mellitus with hyperglycemia, without long-term current use of insulin (HCC) 09/15/2015    Past Surgical History:  Procedure Laterality Date   ABDOMINAL HYSTERECTOMY     fibroids; ovaries intact.   ACHILLES TENDON SURGERY Right 03/02/2017   Procedure: ACHILLES TENDON REPAIR, RIGHT;  Surgeon: Loreta Ave, MD;  Location: [facility];  Service: Orthopedics;  Laterality: Right;   BONE EXCISION Right 03/02/2017   Procedure: EXCISION PARTIAL BONE TALUS/CALCANEUS RIGHT FOOT;  Surgeon: Loreta Ave, MD;  Location: [facility];  Service: Orthopedics;  Laterality: Right;   CHOLECYSTECTOMY     CHOLECYSTECTOMY     KNEE SURGERY      OB History   No obstetric history on file.      Home Medications    Prior to Admission medications   Medication Sig Start Date End Date Taking?  Authorizing Provider  predniSONE (DELTASONE) 20 MG tablet Take 2 tablets (40 mg total) by mouth daily for 5 days. 06/18/23 06/23/23 Yes Bocephus Cali, Acie Fredrickson, FNP  tiZANidine (ZANAFLEX) 2 MG tablet Take 1 tablet (2 mg total) by mouth every 12 (twelve) hours as needed for muscle spasms. 06/18/23  Yes Kateland Leisinger, Rolly Salter E, FNP  benzonatate (TESSALON) 100 MG capsule Take 1 capsule (100 mg total) by mouth every 8 (eight) hours as needed for cough. 08/08/22   Gustavus Bryant, FNP  fluticasone (FLONASE) 50 MCG/ACT nasal spray Place 1 spray into both nostrils daily. 08/08/22   Gustavus Bryant, FNP  oxybutynin (DITROPAN-XL) 10 MG 24 hr tablet TAKE 1 TABLET BY MOUTH EVERYDAY AT BEDTIME 05/12/20   Lezlie Lye, [name], MD  rosuvastatin (CRESTOR) 10 MG tablet Take 1 tablet (10 mg total) by mouth daily. 11/11/20   Just, Azalee Course, FNP  Semaglutide (RYBELSUS) 7 MG TABS Take 7 mg by mouth daily. 11/10/20   Just, Azalee Course, FNP  triamcinolone (KENALOG) 0.1 % Apply 1 application topically 2 (two) times daily. 11/10/20   Just, Azalee Course, FNP  valsartan (DIOVAN) 80 MG tablet Take 1 tablet (80 mg total) by mouth daily. 11/10/20   Just, Azalee Course, FNP    Family History Family History  Problem Relation Age of Onset   COPD Mother  Heart disease Mother        CHF   Multiple myeloma Father    Cancer Father        bone   Lupus Brother     Social History Social History   Tobacco Use   Smoking status: Never   Smokeless tobacco: Never  Vaping Use   Vaping status: Never Used  Substance Use Topics   Alcohol use: No   Drug use: Never     Allergies   Fish allergy and Metformin and related   Review of Systems Review of Systems Per HPI  Physical Exam Triage Vital Signs ED Triage Vitals  Encounter Vitals Group     BP 06/18/23 1307 137/72     Systolic BP Percentile --      Diastolic BP Percentile --      Pulse Rate 06/18/23 1307 85     Resp --      Temp 06/18/23 1307 99 F (37.2 C)     Temp Source 06/18/23 1307 Oral      SpO2 06/18/23 1307 100 %     Weight 06/18/23 1304 215 lb (97.5 kg)     Height 06/18/23 1304 5\' 7"  (1.702 m)     Head Circumference --      Peak Flow --      Pain Score 06/18/23 1304 3     Pain Loc --      Pain Education --      Exclude from Growth Chart --    No data found.  Updated Vital Signs BP 137/72 (BP Location: Left Arm)   Pulse 85   Temp 99 F (37.2 C) (Oral)   Ht 5\' 7"  (1.702 m)   Wt 215 lb (97.5 kg)   SpO2 100%   BMI 33.67 kg/m   Visual Acuity Right Eye Distance:   Left Eye Distance:   Bilateral Distance:    Right Eye Near:   Left Eye Near:    Bilateral Near:     Physical Exam Constitutional:      General: She is not in acute distress.    Appearance: Normal appearance. She is not toxic-appearing or diaphoretic.  HENT:     Head: Normocephalic and atraumatic.  Eyes:     Extraocular Movements: Extraocular movements intact.     Conjunctiva/sclera: Conjunctivae normal.  Pulmonary:     Effort: Pulmonary effort is normal.  Musculoskeletal:     Comments: There is no tenderness to palpation to left upper extremity, left shoulder, left lateral neck muscles.  Movement slightly exacerbates pain. Full ROM of neck present. No direct spinal tenderness, crepitus, stepoff noted. No swelling or discoloration.  Grip strength is 5/5.  Patient appears to be neurovascularly intact.  No tenderness to palpation throughout knees.  No swelling or discoloration.  Range of motion elicits pain.  Appears to be neurovascularly intact.  Neurological:     General: No focal deficit present.     Mental Status: She is alert and oriented to person, place, and time. Mental status is at baseline.  Psychiatric:        Mood and Affect: Mood normal.        Behavior: Behavior normal.        Thought Content: Thought content normal.        Judgment: Judgment normal.      UC Treatments / Results  Labs (all labs ordered are listed, but only abnormal results are displayed) Labs Reviewed - No  data to display  EKG   Radiology No results found.  Procedures Procedures (including critical care time)  Medications Ordered in UC Medications - No data to display  Initial Impression / Assessment and Plan / UC Course  I have reviewed the triage vital signs and the nursing notes.  Pertinent labs & imaging results that were available during my care of the patient were reviewed by me and considered in my medical decision making (see chart for details).     1.  Bilateral knee pain  Suspect patient may be having flareup of arthritis in bilateral knees.  She does have a history of arthritis in right knee.  Imaging was deferred given no obvious injury or direct bony tenderness.  Will prescribe prednisone steroid burst to decrease inflammation associated with this.  Patient does have diabetes so encouraged her to monitor blood glucose very closely at home while on prednisone and to discontinue if glucose remains elevated and follow-up with PCP.  Encouraged her to follow-up with her established orthopedist for further evaluation and management of this as well.  2.  Left shoulder and neck pain  Given no injury or direct bony tenderness to palpation, imaging was deferred.  Suspect muscular strain/injury versus rotator cuff inflammation.  Will treat with muscle relaxer and prednisone should be helpful with this as well.  Creatinine clearance is normal so no dosage adjustment necessary.  Discussed with patient that muscle relaxer can make her drowsy and do not drive or drink alcohol while taking it.  Encouraged follow-up with her established orthopedist for this as well.  Patient verbalized understanding and was agreeable with plan. Final Clinical Impressions(s) / UC Diagnoses   Final diagnoses:  Acute pain of both knees  Acute pain of left shoulder     Discharge Instructions      I have prescribed you prednisone to decrease inflammation associated with your pain.  Please monitor blood  glucose very closely at home while taking prednisone.  Muscle relaxer has also been prescribed.  This can make you drowsy so do not drive or drink alcohol with taking it.  Follow-up with your orthopedist tomorrow for further evaluation and management.    ED Prescriptions     Medication Sig Dispense Auth. Provider   tiZANidine (ZANAFLEX) 2 MG tablet Take 1 tablet (2 mg total) by mouth every 12 (twelve) hours as needed for muscle spasms. 20 tablet [place], [place], [place]   predniSONE (DELTASONE) 20 MG tablet Take 2 tablets (40 mg total) by mouth daily for 5 days. 10 tablet Gustavus Bryant, [place]      PDMP not reviewed this encounter.   Gustavus Bryant, [place] 06/18/23 1404    Gustavus Bryant, [place] 06/18/23 1406

## 2023-07-31 ENCOUNTER — Other Ambulatory Visit: Payer: Self-pay | Admitting: Family

## 2023-07-31 ENCOUNTER — Ambulatory Visit
Admission: RE | Admit: 2023-07-31 | Discharge: 2023-07-31 | Disposition: A | Discharge status: Home / Self Care | Payer: BC Managed Care – PPO | Source: Ambulatory Visit | Attending: Family | Admitting: Family

## 2023-07-31 DIAGNOSIS — M25561 Pain in right knee: Secondary | ICD-10-CM

## 2023-07-31 DIAGNOSIS — M25562 Pain in left knee: Secondary | ICD-10-CM

## 2023-09-12 LAB — HM MAMMOGRAPHY
